# Patient Record
Sex: Female | Born: 1955 | Race: White | Hispanic: No | Marital: Married | Smoking: Former smoker
Health system: Southern US, Community
[De-identification: ages and names within clinical notes are randomized; demographics above are authoritative.]

## PROBLEM LIST (undated history)

## (undated) DIAGNOSIS — M7710 Lateral epicondylitis, unspecified elbow: Secondary | ICD-10-CM

## (undated) HISTORY — DX: Lateral epicondylitis, unspecified elbow: M77.10

---

## 2011-01-28 ENCOUNTER — Emergency Department (INDEPENDENT_AMBULATORY_CARE_PROVIDER_SITE_OTHER)
Admission: EM | Admit: 2011-01-28 | Discharge: 2011-01-28 | Disposition: A | Discharge status: Home / Self Care | Payer: 59 | Source: Home / Self Care | Attending: Emergency Medicine | Admitting: Emergency Medicine

## 2011-01-28 ENCOUNTER — Encounter: Payer: Self-pay | Admitting: *Deleted

## 2011-01-28 DIAGNOSIS — J069 Acute upper respiratory infection, unspecified: Secondary | ICD-10-CM

## 2011-01-28 LAB — POCT RAPID STREP A (OFFICE): Rapid Strep A Screen: NEGATIVE

## 2011-01-28 NOTE — ED Provider Notes (Signed)
History     CSN: [id number]  Arrival date & time 01/28/11  1016   First MD Initiated Contact with Patient 01/28/11 1051      Chief Complaint  Patient presents with  . Sore Throat  . Generalized Body Aches    (Consider location/radiation/quality/duration/timing/severity/associated sxs/prior treatment) HPI Judith Saunders is a 55 y.o. female who complains of onset of cold symptoms for a few days.  + sore throat No cough No pleuritic pain No wheezing + nasal congestion + post-nasal drainage + sinus pain/pressure No chest congestion No itchy/red eyes No earache No hemoptysis No SOB No chills/sweats No fever No nausea No vomiting No abdominal pain No diarrhea No skin rashes No fatigue + myalgias No headache     History reviewed. No pertinent past medical history.  History reviewed. No pertinent past surgical history.  History reviewed. No pertinent family history.  History  Substance Use Topics  . Smoking status: Former Smoker    Quit date: 01/27/2001  . Smokeless tobacco: Not on file  . Alcohol Use: No    OB History    Grav Para Term Preterm Abortions TAB SAB Ect Mult Living                  Review of Systems  Allergies  Eggs or egg-derived products  Home Medications   Current Outpatient Rx  Name Route Sig Dispense Refill  . ALPRAZOLAM 1 MG PO TABS Oral Take 1 mg by mouth at bedtime as needed.      . CHOLECALCIFEROL 400 UNITS PO TABS Oral Take by mouth.      . FLUOXETINE HCL 20 MG PO CAPS Oral Take 20 mg by mouth daily.        BP 133/85  Pulse 75  Temp(Src) 98.4 F (36.9 C) (Oral)  Resp 16  Ht 5' (1.524 m)  Wt 107 lb (48.535 kg)  BMI 20.90 kg/m2  SpO2 97%  Physical Exam  Nursing note and vitals reviewed. Constitutional: She is oriented to person, place, and time. She appears well-developed and well-nourished.  HENT:  Head: Normocephalic and atraumatic.  Right Ear: Tympanic membrane, external ear and ear canal normal.  Left Ear: Tympanic  membrane, external ear and ear canal normal.  Nose: Mucosal edema and rhinorrhea present.  Mouth/Throat: Posterior oropharyngeal erythema present. No oropharyngeal exudate or posterior oropharyngeal edema.  Eyes: No scleral icterus.  Neck: Neck supple.  Cardiovascular: Regular rhythm and normal heart sounds.   Pulmonary/Chest: Effort normal and breath sounds normal. No respiratory distress.  Neurological: She is alert and oriented to person, place, and time.  Skin: Skin is warm and dry.  Psychiatric: She has a normal mood and affect. Her speech is normal.    ED Course  Procedures (including critical care time)   Labs Reviewed  POCT RAPID STREP A (OFFICE)   No results found.   No diagnosis found.    MDM  1)  rapid strep test is negative. No antibiotics or prescriptions are given today since this is most likely viral. 2)  Use nasal saline solution (over the counter) at least 3 times a day. 3)  Use over the counter decongestants like Zyrtec-D every 12 hours as needed to help with congestion.  If you have hypertension, do not take medicines with sudafed.  4)  Can take tylenol every 6 hours or motrin every 8 hours for pain or fever. 5)  Follow up with your primary doctor if no improvement in 5-7 days, sooner if increasing pain,  fever, or new symptoms.     Lily Kocher, MD 01/28/11 1052

## 2011-01-28 NOTE — ED Notes (Signed)
Patient c/o body aches, sore throat and HA x 2 days. She has taken Motrin. She has not received a flu shot this year due to egg allergy.

## 2012-06-29 ENCOUNTER — Emergency Department (INDEPENDENT_AMBULATORY_CARE_PROVIDER_SITE_OTHER): Payer: 59

## 2012-06-29 ENCOUNTER — Emergency Department (INDEPENDENT_AMBULATORY_CARE_PROVIDER_SITE_OTHER)
Admission: EM | Admit: 2012-06-29 | Discharge: 2012-06-29 | Disposition: A | Discharge status: Home / Self Care | Payer: 59 | Source: Home / Self Care | Attending: Family Medicine | Admitting: Family Medicine

## 2012-06-29 ENCOUNTER — Encounter: Payer: Self-pay | Admitting: *Deleted

## 2012-06-29 DIAGNOSIS — IMO0002 Reserved for concepts with insufficient information to code with codable children: Secondary | ICD-10-CM

## 2012-06-29 DIAGNOSIS — M25569 Pain in unspecified knee: Secondary | ICD-10-CM

## 2012-06-29 DIAGNOSIS — W010XXA Fall on same level from slipping, tripping and stumbling without subsequent striking against object, initial encounter: Secondary | ICD-10-CM

## 2012-06-29 DIAGNOSIS — S8390XA Sprain of unspecified site of unspecified knee, initial encounter: Secondary | ICD-10-CM

## 2012-06-29 NOTE — ED Provider Notes (Signed)
History     CSN: [id number]  Arrival date & time 06/29/12  0805   First MD Initiated Contact with Patient 06/29/12 [phone]      Chief Complaint  Patient presents with  . Knee Pain   HPI  Bilateral knee pain x 1 day Pt accidentally fell over dog gate, landing on both knees. Landed on tile floor. Tiles did not break.  Has had anterior knee pain bilaterally since this point.  Pain mild to moderate in nature.  + Pain with knee flexion and extension.  No knee locking or giving away.  No prior hx/o knee surgery.   History reviewed. No pertinent past medical history.  History reviewed. No pertinent past surgical history.  History reviewed. No pertinent family history.  History  Substance Use Topics  . Smoking status: Former Smoker    Quit date: 01/27/2001  . Smokeless tobacco: Never Used  . Alcohol Use: No    OB History   Grav Para Term Preterm Abortions TAB SAB Ect Mult Living                  Review of Systems  All other systems reviewed and are negative.    Allergies  Eggs or egg-derived products and Prednisone  Home Medications   Current Outpatient Rx  Name  Route  Sig  Dispense  Refill  . ALPRAZolam (XANAX) 1 MG tablet   Oral   Take 1 mg by mouth at bedtime as needed.           . cholecalciferol (VITAMIN D) 400 UNITS TABS   Oral   Take by mouth.           Marland Kitchen FLUoxetine (PROZAC) 20 MG capsule   Oral   Take 20 mg by mouth daily.             BP 151/82  Pulse 98  Resp 16  Ht 5' (1.524 m)  Wt 120 lb (54.432 kg)  BMI 23.44 kg/m2  SpO2 99%  Physical Exam  Constitutional: She appears well-developed and well-nourished.  HENT:  Head: Normocephalic and atraumatic.  Eyes: Conjunctivae are normal. Pupils are equal, round, and reactive to light.  Neck: Normal range of motion.  Cardiovascular: Normal rate, regular rhythm and normal heart sounds.   Pulmonary/Chest: Effort normal.  Abdominal: Soft.  Musculoskeletal:       Legs: + TTP bilaterally    No effusions.  Full ROM  + pain with resisted knee extension. Neurovascularly intact distally  + minor soft tissue abrasions on R knee    Neurological: She is alert.  Skin: Skin is warm.    ED Course  Procedures (including critical care time)  Labs Reviewed - No data to display Dg Knee Complete 4 Views Left  06/29/2012   *RADIOLOGY REPORT*  Clinical Data: Fall.  Knee pain.  LEFT KNEE - COMPLETE 4+ VIEW  Comparison: None.  Findings: The left knee is located.  No acute bone or soft tissue abnormality is present.  There is no significant effusion.  The patella is intact.  IMPRESSION: Negative left knee radiographs.   Original Report Authenticated By: Marin Roberts, M.D.   Dg Knee Complete 4 Views Right  06/29/2012   *RADIOLOGY REPORT*  Clinical Data: Knee pain following injury  RIGHT KNEE - COMPLETE 4+ VIEW  Comparison: None.  Findings: No acute fracture or dislocation is noted.  No soft tissue abnormality is seen.  IMPRESSION: No acute abnormality noted.   Original Report Authenticated By: Loraine Leriche  Lukens, M.D.     1. Knee sprain, unspecified laterality, initial encounter       MDM  Xrays negative for fracture.  Suspect minor soft tissue injury.  Knee brace bilaterally  RICE and NSAIDs.  Abrasions cleansed and covered.  Discussed MSK and infectious red flags.  Follow up with sports medicine if any issues.     The patient and/or caregiver has been counseled thoroughly with regard to treatment plan and/or medications prescribed including dosage, schedule, interactions, rationale for use, and possible side effects and they verbalize understanding. Diagnoses and expected course of recovery discussed and will return if not improved as expected or if the condition worsens. Patient and/or caregiver verbalized understanding.            Doree Albee, MD 06/29/12 [phone]

## 2012-06-29 NOTE — ED Notes (Signed)
Yunique fell over a dog gate this AM and landed on both knees on a tile floor. Bilateral knee swelling present.

## 2014-10-06 ENCOUNTER — Ambulatory Visit: Payer: 59 | Attending: Orthopedic Surgery | Admitting: Physical Therapy

## 2014-10-06 ENCOUNTER — Encounter: Payer: Self-pay | Admitting: Physical Therapy

## 2014-10-06 DIAGNOSIS — M7542 Impingement syndrome of left shoulder: Secondary | ICD-10-CM | POA: Insufficient documentation

## 2014-10-06 DIAGNOSIS — M25512 Pain in left shoulder: Secondary | ICD-10-CM | POA: Diagnosis present

## 2014-10-06 NOTE — Therapy (Signed)
[facility] [address] [place], [place], [address] Phone: [phone]   Fax:  [phone]  Physical Therapy Evaluation  Patient Details  Name: Judith Saunders MRN: [id number] Date of Birth: 1955/10/26 Referring Provider:  Pill, Newton Pigg, MD  Encounter Date: 10/06/2014      PT End of Session - 10/06/14 1039    Visit Number 1   Number of Visits 12   Date for PT Re-Evaluation 11/17/14   PT Start Time 0950   PT Stop Time 1033   PT Time Calculation (min) 43 min   Activity Tolerance Patient tolerated treatment well   Behavior During Therapy [facility] for tasks assessed/performed      Past Medical History  Diagnosis Date  . Tennis elbow In past    Patient reports.    No past surgical history on file.  There were no vitals filed for this visit.  Visit Diagnosis:  Left shoulder pain - Plan: PT plan of care cert/re-cert  Shoulder impingement, left - Plan: PT plan of care cert/re-cert      Subjective Assessment - 10/06/14 1011    Subjective I hurt my left shoulder quite awhile ago working out and doing [employer].  My left arm burns.   Patient Stated Goals Get out of pain.            [facility] PT Assessment - 10/06/14 0001    Assessment   Medical Diagnosis Left shoulder pain.   Onset Date/Surgical Date --  1.5 years ago.   Precautions   Precautions None   Restrictions   Weight Bearing Restrictions No   Balance Screen   Has the patient fallen in the past 6 months No   Has the patient had a decrease in activity level because of a fear of falling?  No   Is the patient reluctant to leave their home because of a fear of falling?  No   Home Environment   Living Environment Private residence   ROM / Strength   AROM / PROM / Strength AROM;Strength   AROM   Overall AROM Comments Minimal left shoulder loss of AROM into flexion when contralaterally compared.   Strength   Overall Strength Comments Left shoulder abduction= 4-/5.   Palpation   Palpation comment Very hypersensitive to palpation over distal left triceps region.  Tendr to palpation over left posterior cuff region.   Special Tests    Special Tests Thoracic Outlet Syndrome;Rotator Cuff Impingement  UE DTR's are normal.   Thoracic Outlet Syndrome  Adson Test   Rotator Cuff Impingment tests Neer impingement test;Hawkins- Kennedy test   Adson Test   Findings Negative   Side  Left   Neer Impingement test    Findings Positive   Side Left   Comments --  Referred pain into left middle deltoid region.   Hawkins-Kennedy test   Findings Positive   Side Left   Comments --  Referred pain into left middle deltoid region.                   [facility] Adult PT Treatment/Exercise - 10/06/14 0001    Modalities   Modalities Iontophoresis   Iontophoresis   Type of Iontophoresis Dexamethasone   Location Left acromial ridge left shoulder.   Dose 80 mA/Min   Time Patch.                     PT Long Term Goals - 10/06/14 1025    PT LONG  TERM GOAL #1   Title Ind with HEP.   PT LONG TERM GOAL #2   Title Reduce pain and left UE symptoms to no higher than 2-3/10.               Plan - 10/06/14 1020    Clinical Impression Statement the patient reports injuring her left shoulder more than a year ago lifting weights.  Her CC today however, is a burning over her distal left upper arm that is very sensitive to touch.  Her pain-level today is a 5-6/10 but can at times reach a 10/10.   Pt will benefit from skilled therapeutic intervention in order to improve on the following deficits Pain;Decreased activity tolerance   Rehab Potential Good   PT Frequency 2x / week   PT Duration 6 weeks   PT Treatment/Interventions Cryotherapy;Electrical Stimulation;Iontophoresis 4mg /ml Dexamethasone;Moist Heat;Ultrasound;Therapeutic activities;Therapeutic exercise;Manual techniques   PT Next Visit Plan Please perform STW/M from patient's left elbow region along  her triceps and into herinfraspinatus and Teres Minor region.  Combo e'stim and U/S and Ionto patch.  Also left RW4 and full can exercise on left and sdly ER.   Consulted and Agree with Plan of Care Patient         Problem List There are no active problems to display for this patient.   Alida Greiner, [place] MPT 10/06/2014, 10:42 AM  [facility] [address] [place], [place], [address] Phone: [phone]   Fax:  [phone]

## 2014-10-12 ENCOUNTER — Ambulatory Visit: Payer: 59 | Admitting: Physical Therapy

## 2014-10-12 ENCOUNTER — Encounter: Payer: Self-pay | Admitting: Physical Therapy

## 2014-10-12 DIAGNOSIS — M25512 Pain in left shoulder: Secondary | ICD-10-CM

## 2014-10-12 DIAGNOSIS — M7542 Impingement syndrome of left shoulder: Secondary | ICD-10-CM

## 2014-10-12 NOTE — Therapy (Signed)
[facility] [address] [place], [place], [address] Phone: [phone]   Fax:  [phone]  Physical Therapy Treatment  Patient Details  Name: Judith Saunders MRN: [id number] Date of Birth: [birth year] Referring Provider:  Pill, Newton Pigg, MD  Encounter Date: 10/12/2014      PT End of Session - 10/12/14 1050    Visit Number 2   Number of Visits 12   Date for PT Re-Evaluation 11/17/14   PT Start Time 0900   PT Stop Time 0947   PT Time Calculation (min) 47 min   Activity Tolerance Patient tolerated treatment well   Behavior During Therapy [facility] for tasks assessed/performed      Past Medical History  Diagnosis Date  . Tennis elbow In past    Patient reports.    No past surgical history on file.  There were no vitals filed for this visit.  Visit Diagnosis:  Left shoulder pain  Shoulder impingement, left      Subjective Assessment - 10/12/14 1049    Subjective Reports that upon waking and for a few hours following she has burning, stabbing pain. Throughout the day she may have shocking pain. Reports a tender area near her L elbow.   Patient Stated Goals Get out of pain.   Currently in Pain? Yes   Pain Score 5    Pain Location Elbow   Pain Orientation Left;Posterior   Pain Descriptors / Indicators Burning            [facility] PT Assessment - 10/12/14 0001    Assessment   Medical Diagnosis Left shoulder pain.                     [facility] Adult PT Treatment/Exercise - 10/12/14 0001    Modalities   Modalities Ultrasound;Iontophoresis   Ultrasound   Ultrasound Location L posterior elbow   Ultrasound Parameters 1.2 w/cm2, 100%, 3.3 mhz x10 min   Ultrasound Goals Pain   Iontophoresis   Type of Iontophoresis Dexamethasone   Location L posterior humerus   Dose 80 mA/Min   Time Patch. x8 min   Manual Therapy   Manual Therapy Soft tissue mobilization   Soft tissue mobilization STW/MFR over the L posterior elbow/  humerus/ posterior shoulder to decrease pain                      PT Long Term Goals - 10/06/14 1025    PT LONG TERM GOAL #1   Title Ind with HEP.   PT LONG TERM GOAL #2   Title Reduce pain and left UE symptoms to no higher than 2-3/10.               Plan - 10/12/14 1053    Clinical Impression Statement Patient tolerated treatment well today and only experienced pain during any palpation over the tender area in the posterior L humerus. The tender area is in the posterior L humerus close to tendon junction of the triceps. No abnormal tightness noted in throughout the L posterior shoulder, posterior humerus or elbow. Iontophoresis patch was placed over that spot per consultation with the MPT. Normal modalties response noted following removal of the modalities. Denied pain following treatment.   Pt will benefit from skilled therapeutic intervention in order to improve on the following deficits Pain;Decreased activity tolerance   Rehab Potential Good   PT Frequency 2x / week   PT Duration 6 weeks   PT Treatment/Interventions Cryotherapy;Electrical Stimulation;Iontophoresis  /ml Dexamethasone;Moist Heat;Ultrasound;Therapeutic activities;Therapeutic exercise;Manual techniques   PT Next Visit Plan Please perform STW/M from patient's left elbow region along her triceps and into herinfraspinatus and Teres Minor region.  Combo e'stim and U/S and Ionto patch.  Also left RW4 and full can exercise on left and sdly ER.   Consulted and Agree with Plan of Care Patient        Problem List There are no active problems to display for this patient.   [name], PTA 10/12/2014, 11:00 AM  [facility] [address] [place], [place], [address] Phone: [phone]   Fax:  [phone]

## 2014-10-14 ENCOUNTER — Ambulatory Visit: Payer: 59 | Admitting: Physical Therapy

## 2014-10-14 DIAGNOSIS — M25512 Pain in left shoulder: Secondary | ICD-10-CM

## 2014-10-14 DIAGNOSIS — M7542 Impingement syndrome of left shoulder: Secondary | ICD-10-CM

## 2014-10-14 NOTE — Therapy (Signed)
[facility] [address] [place], [place], [address] Phone: [phone]   Fax:  [phone]  Physical Therapy Treatment  Patient Details  Name: Judith Saunders MRN: [id number] Date of Birth: 12/06/1955 Referring Provider:  Pill, Newton Pigg, MD  Encounter Date: 10/14/2014      PT End of Session - 10/14/14 0901    Visit Number 3   Number of Visits 12   Date for PT Re-Evaluation 11/17/14   PT Start Time 0901   PT Stop Time 0945   PT Time Calculation (min) 44 min   Activity Tolerance Patient tolerated treatment well   Behavior During Therapy [facility] for tasks assessed/performed      Past Medical History  Diagnosis Date  . Tennis elbow In past    Patient reports.    No past surgical history on file.  There were no vitals filed for this visit.  Visit Diagnosis:  Left shoulder pain  Shoulder impingement, left      Subjective Assessment - 10/14/14 0901    Subjective Reports some relief yesterday morning but symptoms returned after activity/ after waking. Morning pain yesterday and today 10/10. Feels like a sunburn and shoes, sheets hurt it as well.   Patient Stated Goals Get out of pain.   Currently in Pain? Yes   Pain Score 5    Pain Location Arm   Pain Orientation Left;Posterior   Pain Descriptors / Indicators Burning            [facility] PT Assessment - 10/14/14 0001    Assessment   Medical Diagnosis (p) Left shoulder pain.                     [facility] Adult PT Treatment/Exercise - 10/14/14 0001    Modalities   Modalities Ultrasound;Iontophoresis   Ultrasound   Ultrasound Location L posteriolateral humerus   Ultrasound Parameters 1.2 w/cm2, 100%,1 mhz   Ultrasound Goals Pain   Iontophoresis   Type of Iontophoresis Dexamethasone   Location L posterior humerus   Dose 80 mA/Min   Time Patch. x8 min   Manual Therapy   Manual Therapy Soft tissue mobilization   Soft tissue mobilization STW/MFR over the L  posterior elbow/ humerus/ posterior shoulder to decrease pain                      PT Long Term Goals - 10/06/14 1025    PT LONG TERM GOAL #1   Title Ind with HEP.   PT LONG TERM GOAL #2   Title Reduce pain and left UE symptoms to no higher than 2-3/10.               Plan - 10/14/14 0951    Clinical Impression Statement Patient tolerated treatment well today with some episodes of pain during STW over the L posteriolateral humerus. Presented with minimal notable tightness and inflammation to the L distal humerus. Experienced shock/shooting pain during STW when the tender area was palpated. Normal modalities response noted following removal of the modalities. Iontophoresis patch placed over the L posteriolateral humerus. Expereinced 5/10 pain following treatment with reports of irritation to the treated area.   Pt will benefit from skilled therapeutic intervention in order to improve on the following deficits Pain;Decreased activity tolerance   Rehab Potential Good   PT Frequency 2x / week   PT Duration 6 weeks   PT Treatment/Interventions Cryotherapy;Electrical Stimulation;Iontophoresis /ml Dexamethasone;Moist Heat;Ultrasound;Therapeutic activities;Therapeutic exercise;Manual techniques   PT Next  Visit Plan Please perform STW/M from patient's left elbow region along her triceps and into herinfraspinatus and Teres Minor region.  Combo e'stim and U/S and Ionto patch.  Also left RW4 and full can exercise on left and sdly ER.   Consulted and Agree with Plan of Care Patient        Problem List There are no active problems to display for this patient.   [name], PTA 10/14/2014, 9:56 AM  [facility] [address] [place], [place], [address] Phone: [phone]   Fax:  [phone]

## 2014-10-18 ENCOUNTER — Encounter: Payer: Self-pay | Admitting: Physical Therapy

## 2014-10-18 ENCOUNTER — Ambulatory Visit: Payer: 59 | Admitting: Physical Therapy

## 2014-10-18 DIAGNOSIS — M7542 Impingement syndrome of left shoulder: Secondary | ICD-10-CM

## 2014-10-18 DIAGNOSIS — M25512 Pain in left shoulder: Secondary | ICD-10-CM

## 2014-10-18 NOTE — Therapy (Signed)
[facility] [address] [place], [place], [address] Phone: [phone]   Fax:  [phone]  Physical Therapy Treatment  Patient Details  Name: Judith Saunders MRN: [id number] Date of Birth: November 10, 1955 Referring Provider:  Pill, Newton Pigg, MD  Encounter Date: 10/18/2014      PT End of Session - 10/18/14 0904    Visit Number 4   Number of Visits 12   Date for PT Re-Evaluation 11/17/14   PT Start Time 0902   PT Stop Time 0945   PT Time Calculation (min) 43 min   Activity Tolerance Patient tolerated treatment well   Behavior During Therapy [facility] for tasks assessed/performed      Past Medical History  Diagnosis Date  . Tennis elbow In past    Patient reports.    No past surgical history on file.  There were no vitals filed for this visit.  Visit Diagnosis:  Left shoulder pain  Shoulder impingement, left      Subjective Assessment - 10/18/14 0902    Subjective Pain started as soon as she woke up yesterday and the morning for hours with burning and shocking pain. Reports feeling that it is her whole arm hurting now. Switched to R side of bed, not changed to extreme activities at home, not picking up heavy things, pain with extending L elbow to pick up coffee cup,   Patient Stated Goals Get out of pain.   Currently in Pain? Yes   Pain Score 7    Pain Location Arm   Pain Orientation Left;Posterior   Pain Descriptors / Indicators Burning                         [facility] Adult PT Treatment/Exercise - 10/18/14 0001    Modalities   Modalities Ultrasound;Iontophoresis   Ultrasound   Ultrasound Location L posterior humerus   Ultrasound Parameters 1.2 w/cm2, 100%,   Ultrasound Goals Pain   Iontophoresis   Type of Iontophoresis Dexamethasone   Location L posterior humerus   Dose 80 mA/Min   Time Patch. x8 min   Manual Therapy   Manual Therapy Soft tissue mobilization   Soft tissue mobilization STW/MFR over  the L posterior elbow/ humerus/ posterior shoulder to decrease pain                      PT Long Term Goals - 10/06/14 1025    PT LONG TERM GOAL #1   Title Ind with HEP.   PT LONG TERM GOAL #2   Title Reduce pain and left UE symptoms to no higher than 2-3/10.               Plan - 10/18/14 0957    Clinical Impression Statement Patient tolerated treatment fairly well today although she had more painful sensations during any contact with L posterior humerus. Continues to have minimal notable inflammation and tightness in L posterior humerus. Continues to report shock/burning sensations at home intermittantly during the day. Normal modalities response noted following removal of the modalities. Iontophoresi patch placed over distal L posterior humerus. Experienced 7/10 pain following treatment.   Pt will benefit from skilled therapeutic intervention in order to improve on the following deficits Pain;Decreased activity tolerance   Rehab Potential Good   PT Frequency 2x / week   PT Duration 6 weeks   PT Treatment/Interventions Cryotherapy;Electrical Stimulation;Iontophoresis /ml Dexamethasone;Moist Heat;Ultrasound;Therapeutic activities;Therapeutic exercise;Manual techniques   PT Next Visit Plan Please  perform STW/M from patient's left elbow region along her triceps and into herinfraspinatus and Teres Minor region.  Combo e'stim and U/S and Ionto patch.  Also left RW4 and full can exercise on left and sdly ER.   Consulted and Agree with Plan of Care Patient        Problem List There are no active problems to display for this patient.   [name], PTA 10/18/2014, 10:03 AM  [facility] [address] [place], [place], [address] Phone: [phone]   Fax:  [phone]

## 2014-10-20 ENCOUNTER — Ambulatory Visit: Payer: 59 | Admitting: Physical Therapy

## 2014-10-20 ENCOUNTER — Encounter: Payer: Self-pay | Admitting: Physical Therapy

## 2014-10-20 DIAGNOSIS — M7542 Impingement syndrome of left shoulder: Secondary | ICD-10-CM

## 2014-10-20 DIAGNOSIS — M25512 Pain in left shoulder: Secondary | ICD-10-CM

## 2014-10-20 NOTE — Therapy (Signed)
[facility] [address] [place], [place], [address] Phone: [phone]   Fax:  [phone]  Physical Therapy Treatment  Patient Details  Name: Judith Saunders MRN: [id number] Date of Birth: 01-18-1956 Referring Provider:  Pill, Newton Pigg, MD  Encounter Date: 10/20/2014      PT End of Session - 10/20/14 0953    Visit Number 5   Number of Visits 12   PT Start Time 0906   PT Stop Time 0945   PT Time Calculation (min) 39 min   Activity Tolerance Patient tolerated treatment well   Behavior During Therapy [facility] for tasks assessed/performed      Past Medical History  Diagnosis Date  . Tennis elbow In past    Patient reports.    No past surgical history on file.  There were no vitals filed for this visit.  Visit Diagnosis:  Left shoulder pain  Shoulder impingement, left      Subjective Assessment - 10/20/14 1021    Subjective Reports that she picked up a case of beer with RUE yesterday and RUE began hurting. Continues to have shooting/burning/shocking pain and skin sensitivity.            [facility] PT Assessment - 10/20/14 0001    Assessment   Medical Diagnosis Left shoulder pain.                     [facility] Adult PT Treatment/Exercise - 10/20/14 0001    Modalities   Modalities Ultrasound;Iontophoresis   Ultrasound   Ultrasound Location L posterior humerus   Ultrasound Parameters 1.2 w/cm2, 100%, 1 mhz x10 min   Ultrasound Goals Pain   Iontophoresis   Type of Iontophoresis Dexamethasone   Location L posterior humerus   Dose 80 mA/Min   Time Patch. x8 min   Manual Therapy   Manual Therapy Soft tissue mobilization   Soft tissue mobilization STW/MFR over the L posterior elbow/ humerus to decrease pain                      PT Long Term Goals - 10/06/14 1025    PT LONG TERM GOAL #1   Title Ind with HEP.   PT LONG TERM GOAL #2   Title Reduce pain and left UE symptoms to no higher than 2-3/10.               Plan - 10/20/14 0955    Clinical Impression Statement Patient tolerated treatment fairly well today although she experienced the intermitant skin sensitivity during any contact with L posterior humerus. Continues to have minimal notable inflammation and tightness in L posterior humerus. Continues to report the burning/shocking/shooting pain intermittantly throughout the day at home. Normal modaliites response noted following removal of the modalities. Iontophoresis patch placed over the distal L posterior humerus.    Pt will benefit from skilled therapeutic intervention in order to improve on the following deficits Pain;Decreased activity tolerance   Rehab Potential Good   PT Frequency 2x / week   PT Duration 6 weeks   PT Treatment/Interventions Cryotherapy;Electrical Stimulation;Iontophoresis /ml Dexamethasone;Moist Heat;Ultrasound;Therapeutic activities;Therapeutic exercise;Manual techniques   PT Next Visit Plan Please perform STW/M from patient's left elbow region along her triceps and into herinfraspinatus and [place] region.  Combo e'stim and U/S and Ionto patch.  Also left RW4 and full can exercise on left and sdly ER.   Consulted and Agree with Plan of Care Patient     No significant relief  of symptoms after 5 treatments.   Problem List There are no active problems to display for this patient.   Florence Canner, PTA 10/20/2014 10:27 AM  [place] MPT [facility] [address] [place], [place], [address] Phone: [phone]   Fax:  [phone]

## 2014-10-24 ENCOUNTER — Encounter: Payer: 59 | Admitting: Physical Therapy

## 2014-10-26 ENCOUNTER — Encounter: Payer: 59 | Admitting: Physical Therapy

## 2014-11-02 ENCOUNTER — Telehealth: Payer: Self-pay | Admitting: Family

## 2014-11-04 ENCOUNTER — Encounter: Payer: Self-pay | Admitting: Family

## 2014-11-04 ENCOUNTER — Ambulatory Visit (INDEPENDENT_AMBULATORY_CARE_PROVIDER_SITE_OTHER): Payer: 59 | Admitting: Family

## 2014-11-04 VITALS — BP 135/80 | HR 74 | Temp 97.7°F | Ht 59.75 in | Wt 125.2 lb

## 2014-11-04 DIAGNOSIS — F411 Generalized anxiety disorder: Secondary | ICD-10-CM | POA: Diagnosis not present

## 2014-11-04 DIAGNOSIS — M79602 Pain in left arm: Secondary | ICD-10-CM | POA: Insufficient documentation

## 2014-11-04 NOTE — Progress Notes (Signed)
   Subjective:    Patient ID: Judith Saunders, female    DOB: 1955/11/01, 59 y.o.   MRN: [id number]  Pt presents to the office today to establish care. Pt states she moved to this area in February and hates to travel to [place] for her primary care provider. PT currently taking medication for Insomnia and GAD. Pt states she is seeing an ortho doctor who manages  her left arm/shoulder pain. Pt states she started having left arm pain a year ago. PT states she is taking Norco 5-$RemoveBefore'325mg'YOocsaCOSSuFG$  or Ultram one tablet daily. States she only takes one medication and 1 tablet daily depending on the pain.  Anxiety Presents for initial visit. Onset was more than 5 years ago. The problem has been waxing and waning. Symptoms include insomnia. Patient reports no depressed mood, excessive worry, nervous/anxious behavior, palpitations, panic, restlessness or shortness of breath. Symptoms occur rarely. The severity of symptoms is mild. The symptoms are aggravated by family issues. The quality of sleep is good.   Her past medical history is significant for anxiety/panic attacks. There is no history of depression. Past treatments include benzodiazephines.      Review of Systems  Constitutional: Negative.   HENT: Negative.   Eyes: Negative.   Respiratory: Negative.  Negative for shortness of breath.   Cardiovascular: Negative.  Negative for palpitations.  Gastrointestinal: Negative.   Endocrine: Negative.   Genitourinary: Negative.   Musculoskeletal: Negative.   Neurological: Negative.  Negative for headaches.  Hematological: Negative.   Psychiatric/Behavioral: The patient has insomnia. The patient is not nervous/anxious.   All other systems reviewed and are negative.      Objective:   Physical Exam  Constitutional: She is oriented to person, place, and time. She appears well-developed and well-nourished. No distress.  HENT:  Head: Normocephalic and atraumatic.  Right Ear: External ear normal.  Left Ear:  External ear normal.  Nose: Nose normal.  Mouth/Throat: Oropharynx is clear and moist.  Eyes: Pupils are equal, round, and reactive to light.  Neck: Normal range of motion. Neck supple. No thyromegaly present.  Cardiovascular: Normal rate, regular rhythm, normal heart sounds and intact distal pulses.   No murmur heard. Pulmonary/Chest: Effort normal and breath sounds normal. No respiratory distress. She has no wheezes.  Abdominal: Soft. Bowel sounds are normal. She exhibits no distension. There is no tenderness.  Musculoskeletal: Normal range of motion. She exhibits no edema or tenderness.  Neurological: She is alert and oriented to person, place, and time. She has normal reflexes. No cranial nerve deficit.  Skin: Skin is warm and dry.  Psychiatric: She has a normal mood and affect. Her behavior is normal. Judgment and thought content normal.  Vitals reviewed.     BP 135/80 mmHg  Pulse 74  Temp(Src) 97.7 F (36.5 C) (Oral)  Ht 4' 11.75" (1.518 m)  Wt 125 lb 3.2 oz (56.79 kg)  BMI 24.64 kg/m2     Assessment & Plan:  1. GAD (generalized anxiety disorder) -Stress management discussed - CMP14+EGFR  2. Left arm pain -Keep ortho appointments - CMP14+EGFR  Discussed with patient I could not prescribe her xanax and ativan together. Pt states she is okay with this because she only takes the ativan every once in awhile.   Continue all meds Labs pending Health Maintenance reviewed Diet and exercise encouraged RTO 6 months   Evelina Dun, FNP

## 2014-11-04 NOTE — Patient Instructions (Signed)
Health Maintenance, Female Adopting a healthy lifestyle and getting preventive care can go a long way to promote health and wellness. Talk with your health care provider about what schedule of regular examinations is right for you. This is a good chance for you to check in with your provider about disease prevention and staying healthy. In between checkups, there are plenty of things you can do on your own. Experts have done a lot of research about which lifestyle changes and preventive measures are most likely to keep you healthy. Ask your health care provider for more information. WEIGHT AND DIET  Eat a healthy diet  Be sure to include plenty of vegetables, fruits, low-fat dairy products, and lean protein.  Do not eat a lot of foods high in solid fats, added sugars, or salt.  Get regular exercise. This is one of the most important things you can do for your health.  Most adults should exercise for at least 150 minutes each week. The exercise should increase your heart rate and make you sweat (moderate-intensity exercise).  Most adults should also do strengthening exercises at least twice a week. This is in addition to the moderate-intensity exercise.  Maintain a healthy weight  Body mass index (BMI) is a measurement that can be used to identify possible weight problems. It estimates body fat based on height and weight. Your health care provider can help determine your BMI and help you achieve or maintain a healthy weight.  For females 20 years of age and older:   A BMI below 18.5 is considered underweight.  A BMI of 18.5 to 24.9 is normal.  A BMI of 25 to 29.9 is considered overweight.  A BMI of 30 and above is considered obese.  Watch levels of cholesterol and blood lipids  You should start having your blood tested for lipids and cholesterol at 58 years of age, then have this test every 5 years.  You may need to have your cholesterol levels checked more often if:  Your lipid  or cholesterol levels are high.  You are older than 58 years of age.  You are at high risk for heart disease.  CANCER SCREENING   Lung Cancer  Lung cancer screening is recommended for adults 55-80 years old who are at high risk for lung cancer because of a history of smoking.  A yearly low-dose CT scan of the lungs is recommended for people who:  Currently smoke.  Have quit within the past 15 years.  Have at least a 30-pack-year history of smoking. A pack year is smoking an average of one pack of cigarettes a day for 1 year.  Yearly screening should continue until it has been 15 years since you quit.  Yearly screening should stop if you develop a health problem that would prevent you from having lung cancer treatment.  Breast Cancer  Practice breast self-awareness. This means understanding how your breasts normally appear and feel.  It also means doing regular breast self-exams. Let your health care provider know about any changes, no matter how small.  If you are in your 20s or 30s, you should have a clinical breast exam (CBE) by a health care provider every 1-3 years as part of a regular health exam.  If you are 40 or older, have a CBE every year. Also consider having a breast X-ray (mammogram) every year.  If you have a family history of breast cancer, talk to your health care provider about genetic screening.  If you   are at high risk for breast cancer, talk to your health care provider about having an MRI and a mammogram every year.  Breast cancer gene (BRCA) assessment is recommended for women who have family members with BRCA-related cancers. BRCA-related cancers include:  Breast.  Ovarian.  Tubal.  Peritoneal cancers.  Results of the assessment will determine the need for genetic counseling and BRCA1 and BRCA2 testing. Cervical Cancer Your health care provider may recommend that you be screened regularly for cancer of the pelvic organs (ovaries, uterus, and  vagina). This screening involves a pelvic examination, including checking for microscopic changes to the surface of your cervix (Pap test). You may be encouraged to have this screening done every 3 years, beginning at age 21.  For women ages 30-65, health care providers may recommend pelvic exams and Pap testing every 3 years, or they may recommend the Pap and pelvic exam, combined with testing for human papilloma virus (HPV), every 5 years. Some types of HPV increase your risk of cervical cancer. Testing for HPV may also be done on women of any age with unclear Pap test results.  Other health care providers may not recommend any screening for nonpregnant women who are considered low risk for pelvic cancer and who do not have symptoms. Ask your health care provider if a screening pelvic exam is right for you.  If you have had past treatment for cervical cancer or a condition that could lead to cancer, you need Pap tests and screening for cancer for at least 20 years after your treatment. If Pap tests have been discontinued, your risk factors (such as having a new sexual partner) need to be reassessed to determine if screening should resume. Some women have medical problems that increase the chance of getting cervical cancer. In these cases, your health care provider may recommend more frequent screening and Pap tests. Colorectal Cancer  This type of cancer can be detected and often prevented.  Routine colorectal cancer screening usually begins at 58 years of age and continues through 58 years of age.  Your health care provider may recommend screening at an earlier age if you have risk factors for colon cancer.  Your health care provider may also recommend using home test kits to check for hidden blood in the stool.  A small camera at the end of a tube can be used to examine your colon directly (sigmoidoscopy or colonoscopy). This is done to check for the earliest forms of colorectal  cancer.  Routine screening usually begins at age 50.  Direct examination of the colon should be repeated every 5-10 years through 58 years of age. However, you may need to be screened more often if early forms of precancerous polyps or small growths are found. Skin Cancer  Check your skin from head to toe regularly.  Tell your health care provider about any new moles or changes in moles, especially if there is a change in a mole's shape or color.  Also tell your health care provider if you have a mole that is larger than the size of a pencil eraser.  Always use sunscreen. Apply sunscreen liberally and repeatedly throughout the day.  Protect yourself by wearing long sleeves, pants, a wide-brimmed hat, and sunglasses whenever you are outside. HEART DISEASE, DIABETES, AND HIGH BLOOD PRESSURE   High blood pressure causes heart disease and increases the risk of stroke. High blood pressure is more likely to develop in:  People who have blood pressure in the high end   of the normal range (130-139/85-89 mm Hg).  People who are overweight or obese.  People who are African American.  If you are 38-23 years of age, have your blood pressure checked every 3-5 years. If you are 61 years of age or older, have your blood pressure checked every year. You should have your blood pressure measured twice--once when you are at a hospital or clinic, and once when you are not at a hospital or clinic. Record the average of the two measurements. To check your blood pressure when you are not at a hospital or clinic, you can use:  An automated blood pressure machine at a pharmacy.  A home blood pressure monitor.  If you are between 45 years and 39 years old, ask your health care provider if you should take aspirin to prevent strokes.  Have regular diabetes screenings. This involves taking a blood sample to check your fasting blood sugar level.  If you are at a normal weight and have a low risk for diabetes,  have this test once every three years after 58 years of age.  If you are overweight and have a high risk for diabetes, consider being tested at a younger age or more often. PREVENTING INFECTION  Hepatitis B  If you have a higher risk for hepatitis B, you should be screened for this virus. You are considered at high risk for hepatitis B if:  You were born in a country where hepatitis B is common. Ask your health care provider which countries are considered high risk.  Your parents were born in a high-risk country, and you have not been immunized against hepatitis B (hepatitis B vaccine).  You have HIV or AIDS.  You use needles to inject street drugs.  You live with someone who has hepatitis B.  You have had sex with someone who has hepatitis B.  You get hemodialysis treatment.  You take certain medicines for conditions, including cancer, organ transplantation, and autoimmune conditions. Hepatitis C  Blood testing is recommended for:  Everyone born from [birth year] through 1964.  Anyone with known risk factors for hepatitis C. Sexually transmitted infections (STIs)  You should be screened for sexually transmitted infections (STIs) including gonorrhea and chlamydia if:  You are sexually active and are younger than 58 years of age.  You are older than 58 years of age and your health care provider tells you that you are at risk for this type of infection.  Your sexual activity has changed since you were last screened and you are at an increased risk for chlamydia or gonorrhea. Ask your health care provider if you are at risk.  If you do not have HIV, but are at risk, it may be recommended that you take a prescription medicine daily to prevent HIV infection. This is called pre-exposure prophylaxis (PrEP). You are considered at risk if:  You are sexually active and do not regularly use condoms or know the HIV status of your partner(s).  You take drugs by injection.  You are sexually  active with a partner who has HIV. Talk with your health care provider about whether you are at high risk of being infected with HIV. If you choose to begin PrEP, you should first be tested for HIV. You should then be tested every 3 months for as long as you are taking PrEP.  PREGNANCY   If you are premenopausal and you may become pregnant, ask your health care provider about preconception counseling.  If you may  become pregnant, take 400 to 800 micrograms (mcg) of folic acid every day.  If you want to prevent pregnancy, talk to your health care provider about birth control (contraception). OSTEOPOROSIS AND MENOPAUSE   Osteoporosis is a disease in which the bones lose minerals and strength with aging. This can result in serious bone fractures. Your risk for osteoporosis can be identified using a bone density scan.  If you are 61 years of age or older, or if you are at risk for osteoporosis and fractures, ask your health care provider if you should be screened.  Ask your health care provider whether you should take a calcium or vitamin D supplement to lower your risk for osteoporosis.  Menopause may have certain physical symptoms and risks.  Hormone replacement therapy may reduce some of these symptoms and risks. Talk to your health care provider about whether hormone replacement therapy is right for you.  HOME CARE INSTRUCTIONS   Schedule regular health, dental, and eye exams.  Stay current with your immunizations.   Do not use any tobacco products including cigarettes, chewing tobacco, or electronic cigarettes.  If you are pregnant, do not drink alcohol.  If you are breastfeeding, limit how much and how often you drink alcohol.  Limit alcohol intake to no more than 1 drink per day for nonpregnant women. One drink equals 12 ounces of beer, 5 ounces of wine, or 1 ounces of hard liquor.  Do not use street drugs.  Do not share needles.  Ask your health care provider for help if  you need support or information about quitting drugs.  Tell your health care provider if you often feel depressed.  Tell your health care provider if you have ever been abused or do not feel safe at home.   This information is not intended to replace advice given to you by your health care provider. Make sure you discuss any questions you have with your health care provider.   Document Released: 07/30/2010 Document Revised: 02/04/2014 Document Reviewed: 12/16/2012 Elsevier Interactive Patient Education [employer].

## 2014-11-05 LAB — CMP14+EGFR
ALK PHOS: 58 IU/L (ref 39–117)
ALT: 11 IU/L (ref 0–32)
AST: 16 IU/L (ref 0–40)
Albumin/Globulin Ratio: 1.6 (ref 1.1–2.5)
Albumin: 4.5 g/dL (ref 3.5–5.5)
BUN/Creatinine Ratio: 20 (ref 9–23)
BUN: 16 mg/dL (ref 6–24)
Bilirubin Total: 0.3 mg/dL (ref 0.0–1.2)
CALCIUM: 9.5 mg/dL (ref 8.7–10.2)
CO2: 26 mmol/L (ref 18–29)
CREATININE: 0.79 mg/dL (ref 0.57–1.00)
Chloride: 101 mmol/L (ref 97–108)
GFR calc Af Amer: 96 mL/min/{1.73_m2} (ref >59)
GFR, EST NON AFRICAN AMERICAN: 83 mL/min/{1.73_m2} (ref >59)
GLOBULIN, TOTAL: 2.8 g/dL (ref 1.5–4.5)
GLUCOSE: 109 mg/dL — AB (ref 65–99)
Potassium: 4.6 mmol/L (ref 3.5–5.2)
SODIUM: 143 mmol/L (ref 134–144)
Total Protein: 7.3 g/dL (ref 6.0–8.5)

## 2015-01-27 ENCOUNTER — Encounter: Payer: Self-pay | Admitting: *Deleted

## 2015-02-07 ENCOUNTER — Other Ambulatory Visit: Payer: 59 | Admitting: Family

## 2015-02-13 ENCOUNTER — Other Ambulatory Visit: Payer: 59 | Admitting: Family

## 2015-02-20 ENCOUNTER — Other Ambulatory Visit: Payer: 59 | Admitting: Family

## 2015-04-17 ENCOUNTER — Encounter: Payer: Self-pay | Admitting: Family

## 2015-04-17 ENCOUNTER — Ambulatory Visit (INDEPENDENT_AMBULATORY_CARE_PROVIDER_SITE_OTHER): Payer: 59 | Admitting: Family

## 2015-04-17 VITALS — BP 137/78 | HR 88 | Temp 97.2°F | Ht 59.75 in | Wt 125.0 lb

## 2015-04-17 DIAGNOSIS — M79602 Pain in left arm: Secondary | ICD-10-CM | POA: Diagnosis not present

## 2015-04-17 DIAGNOSIS — F411 Generalized anxiety disorder: Secondary | ICD-10-CM | POA: Diagnosis not present

## 2015-04-17 DIAGNOSIS — Z01419 Encounter for gynecological examination (general) (routine) without abnormal findings: Secondary | ICD-10-CM

## 2015-04-17 NOTE — Patient Instructions (Signed)
Health Maintenance, Female Adopting a healthy lifestyle and getting preventive care can go a long way to promote health and wellness. Talk with your health care provider about what schedule of regular examinations is right for you. This is a good chance for you to check in with your provider about disease prevention and staying healthy. In between checkups, there are plenty of things you can do on your own. Experts have done a lot of research about which lifestyle changes and preventive measures are most likely to keep you healthy. Ask your health care provider for more information. WEIGHT AND DIET  Eat a healthy diet  Be sure to include plenty of vegetables, fruits, low-fat dairy products, and lean protein.  Do not eat a lot of foods high in solid fats, added sugars, or salt.  Get regular exercise. This is one of the most important things you can do for your health.  Most adults should exercise for at least 150 minutes each week. The exercise should increase your heart rate and make you sweat (moderate-intensity exercise).  Most adults should also do strengthening exercises at least twice a week. This is in addition to the moderate-intensity exercise.  Maintain a healthy weight  Body mass index (BMI) is a measurement that can be used to identify possible weight problems. It estimates body fat based on height and weight. Your health care provider can help determine your BMI and help you achieve or maintain a healthy weight.  For females 20 years of age and older:   A BMI below 18.5 is considered underweight.  A BMI of 18.5 to 24.9 is normal.  A BMI of 25 to 29.9 is considered overweight.  A BMI of 30 and above is considered obese.  Watch levels of cholesterol and blood lipids  You should start having your blood tested for lipids and cholesterol at 59 years of age, then have this test every 5 years.  You may need to have your cholesterol levels checked more often if:  Your lipid  or cholesterol levels are high.  You are older than 59 years of age.  You are at high risk for heart disease.  CANCER SCREENING   Lung Cancer  Lung cancer screening is recommended for adults 55-80 years old who are at high risk for lung cancer because of a history of smoking.  A yearly low-dose CT scan of the lungs is recommended for people who:  Currently smoke.  Have quit within the past 15 years.  Have at least a 30-pack-year history of smoking. A pack year is smoking an average of one pack of cigarettes a day for 1 year.  Yearly screening should continue until it has been 15 years since you quit.  Yearly screening should stop if you develop a health problem that would prevent you from having lung cancer treatment.  Breast Cancer  Practice breast self-awareness. This means understanding how your breasts normally appear and feel.  It also means doing regular breast self-exams. Let your health care provider know about any changes, no matter how small.  If you are in your 20s or 30s, you should have a clinical breast exam (CBE) by a health care provider every 1-3 years as part of a regular health exam.  If you are 40 or older, have a CBE every year. Also consider having a breast X-ray (mammogram) every year.  If you have a family history of breast cancer, talk to your health care provider about genetic screening.  If you   are at high risk for breast cancer, talk to your health care provider about having an MRI and a mammogram every year.  Breast cancer gene (BRCA) assessment is recommended for women who have family members with BRCA-related cancers. BRCA-related cancers include:  Breast.  Ovarian.  Tubal.  Peritoneal cancers.  Results of the assessment will determine the need for genetic counseling and BRCA1 and BRCA2 testing. Cervical Cancer Your health care provider may recommend that you be screened regularly for cancer of the pelvic organs (ovaries, uterus, and  vagina). This screening involves a pelvic examination, including checking for microscopic changes to the surface of your cervix (Pap test). You may be encouraged to have this screening done every 3 years, beginning at age 21.  For women ages 30-65, health care providers may recommend pelvic exams and Pap testing every 3 years, or they may recommend the Pap and pelvic exam, combined with testing for human papilloma virus (HPV), every 5 years. Some types of HPV increase your risk of cervical cancer. Testing for HPV may also be done on women of any age with unclear Pap test results.  Other health care providers may not recommend any screening for nonpregnant women who are considered low risk for pelvic cancer and who do not have symptoms. Ask your health care provider if a screening pelvic exam is right for you.  If you have had past treatment for cervical cancer or a condition that could lead to cancer, you need Pap tests and screening for cancer for at least 20 years after your treatment. If Pap tests have been discontinued, your risk factors (such as having a new sexual partner) need to be reassessed to determine if screening should resume. Some women have medical problems that increase the chance of getting cervical cancer. In these cases, your health care provider may recommend more frequent screening and Pap tests. Colorectal Cancer  This type of cancer can be detected and often prevented.  Routine colorectal cancer screening usually begins at 59 years of age and continues through 59 years of age.  Your health care provider may recommend screening at an earlier age if you have risk factors for colon cancer.  Your health care provider may also recommend using home test kits to check for hidden blood in the stool.  A small camera at the end of a tube can be used to examine your colon directly (sigmoidoscopy or colonoscopy). This is done to check for the earliest forms of colorectal  cancer.  Routine screening usually begins at age 50.  Direct examination of the colon should be repeated every 5-10 years through 59 years of age. However, you may need to be screened more often if early forms of precancerous polyps or small growths are found. Skin Cancer  Check your skin from head to toe regularly.  Tell your health care provider about any new moles or changes in moles, especially if there is a change in a mole's shape or color.  Also tell your health care provider if you have a mole that is larger than the size of a pencil eraser.  Always use sunscreen. Apply sunscreen liberally and repeatedly throughout the day.  Protect yourself by wearing long sleeves, pants, a wide-brimmed hat, and sunglasses whenever you are outside. HEART DISEASE, DIABETES, AND HIGH BLOOD PRESSURE   High blood pressure causes heart disease and increases the risk of stroke. High blood pressure is more likely to develop in:  People who have blood pressure in the high end   of the normal range (130-139/85-89 mm Hg).  People who are overweight or obese.  People who are African American.  If you are 38-23 years of age, have your blood pressure checked every 3-5 years. If you are 61 years of age or older, have your blood pressure checked every year. You should have your blood pressure measured twice--once when you are at a hospital or clinic, and once when you are not at a hospital or clinic. Record the average of the two measurements. To check your blood pressure when you are not at a hospital or clinic, you can use:  An automated blood pressure machine at a pharmacy.  A home blood pressure monitor.  If you are between 45 years and 39 years old, ask your health care provider if you should take aspirin to prevent strokes.  Have regular diabetes screenings. This involves taking a blood sample to check your fasting blood sugar level.  If you are at a normal weight and have a low risk for diabetes,  have this test once every three years after 59 years of age.  If you are overweight and have a high risk for diabetes, consider being tested at a younger age or more often. PREVENTING INFECTION  Hepatitis B  If you have a higher risk for hepatitis B, you should be screened for this virus. You are considered at high risk for hepatitis B if:  You were born in a country where hepatitis B is common. Ask your health care provider which countries are considered high risk.  Your parents were born in a high-risk country, and you have not been immunized against hepatitis B (hepatitis B vaccine).  You have HIV or AIDS.  You use needles to inject street drugs.  You live with someone who has hepatitis B.  You have had sex with someone who has hepatitis B.  You get hemodialysis treatment.  You take certain medicines for conditions, including cancer, organ transplantation, and autoimmune conditions. Hepatitis C  Blood testing is recommended for:  Everyone born from [birth year] through 1964.  Anyone with known risk factors for hepatitis C. Sexually transmitted infections (STIs)  You should be screened for sexually transmitted infections (STIs) including gonorrhea and chlamydia if:  You are sexually active and are younger than 59 years of age.  You are older than 59 years of age and your health care provider tells you that you are at risk for this type of infection.  Your sexual activity has changed since you were last screened and you are at an increased risk for chlamydia or gonorrhea. Ask your health care provider if you are at risk.  If you do not have HIV, but are at risk, it may be recommended that you take a prescription medicine daily to prevent HIV infection. This is called pre-exposure prophylaxis (PrEP). You are considered at risk if:  You are sexually active and do not regularly use condoms or know the HIV status of your partner(s).  You take drugs by injection.  You are sexually  active with a partner who has HIV. Talk with your health care provider about whether you are at high risk of being infected with HIV. If you choose to begin PrEP, you should first be tested for HIV. You should then be tested every 3 months for as long as you are taking PrEP.  PREGNANCY   If you are premenopausal and you may become pregnant, ask your health care provider about preconception counseling.  If you may  become pregnant, take 400 to 800 micrograms (mcg) of folic acid every day.  If you want to prevent pregnancy, talk to your health care provider about birth control (contraception). OSTEOPOROSIS AND MENOPAUSE   Osteoporosis is a disease in which the bones lose minerals and strength with aging. This can result in serious bone fractures. Your risk for osteoporosis can be identified using a bone density scan.  If you are 61 years of age or older, or if you are at risk for osteoporosis and fractures, ask your health care provider if you should be screened.  Ask your health care provider whether you should take a calcium or vitamin D supplement to lower your risk for osteoporosis.  Menopause may have certain physical symptoms and risks.  Hormone replacement therapy may reduce some of these symptoms and risks. Talk to your health care provider about whether hormone replacement therapy is right for you.  HOME CARE INSTRUCTIONS   Schedule regular health, dental, and eye exams.  Stay current with your immunizations.   Do not use any tobacco products including cigarettes, chewing tobacco, or electronic cigarettes.  If you are pregnant, do not drink alcohol.  If you are breastfeeding, limit how much and how often you drink alcohol.  Limit alcohol intake to no more than 1 drink per day for nonpregnant women. One drink equals 12 ounces of beer, 5 ounces of wine, or 1 ounces of hard liquor.  Do not use street drugs.  Do not share needles.  Ask your health care provider for help if  you need support or information about quitting drugs.  Tell your health care provider if you often feel depressed.  Tell your health care provider if you have ever been abused or do not feel safe at home.   This information is not intended to replace advice given to you by your health care provider. Make sure you discuss any questions you have with your health care provider.   Document Released: 07/30/2010 Document Revised: 02/04/2014 Document Reviewed: 12/16/2012 Elsevier Interactive Patient Education [employer].

## 2015-04-17 NOTE — Progress Notes (Signed)
   Subjective:    Patient ID: Judith KindleLori Holladay, female    DOB: 07-Mar-1955, 60 y.o.   MRN: [id number]  Pt presents to the office today for pap. Pt states she had a Annual physical in 02/28/15. Pt states her blood work was "good". PT states she had neck surgery in January and is followed by Ortho every month right now.  Gynecologic Exam Pertinent negatives include no headaches.  Anxiety Presents for follow-up visit. Onset was more than 5 years ago. The problem has been waxing and waning. Symptoms include excessive worry and nervous/anxious behavior. Patient reports no confusion, depressed mood, insomnia, malaise, palpitations or shortness of breath. Symptoms occur occasionally. The quality of sleep is good.   Her past medical history is significant for anxiety/panic attacks. There is no history of depression. Past treatments include benzodiazephines. The treatment provided moderate relief. Compliance with prior treatments has been good.      Review of Systems  Constitutional: Negative.   HENT: Negative.   Eyes: Negative.   Respiratory: Negative.  Negative for shortness of breath.   Cardiovascular: Negative.  Negative for palpitations.  Gastrointestinal: Negative.   Endocrine: Negative.   Genitourinary: Negative.   Musculoskeletal: Negative.   Neurological: Negative.  Negative for headaches.  Hematological: Negative.   Psychiatric/Behavioral: Negative for confusion. The patient is nervous/anxious. The patient does not have insomnia.   All other systems reviewed and are negative.      Objective:   Physical Exam  Constitutional: She is oriented to person, place, and time. She appears well-developed and well-nourished. No distress.  HENT:  Head: Normocephalic and atraumatic.  Right Ear: External ear normal.  Left Ear: External ear normal.  Nose: Nose normal.  Mouth/Throat: Oropharynx is clear and moist.  Eyes: Pupils are equal, round, and reactive to light.  Neck: Normal range of  motion. Neck supple. No thyromegaly present.  Cardiovascular: Normal rate, regular rhythm, normal heart sounds and intact distal pulses.   No murmur heard. Pulmonary/Chest: Effort normal and breath sounds normal. No respiratory distress. She has no wheezes. Right breast exhibits no inverted nipple, no mass, no nipple discharge, no skin change and no tenderness. Left breast exhibits no inverted nipple, no mass, no nipple discharge, no skin change and no tenderness. Breasts are symmetrical.  Abdominal: Soft. Bowel sounds are normal. She exhibits no distension. There is no tenderness.  Genitourinary: Vagina normal.  Bimanual exam- no adnexal masses or tenderness, ovaries nonpalpable   Cervix parous and pink- No discharge   Musculoskeletal: Normal range of motion. She exhibits no edema or tenderness.  Neurological: She is alert and oriented to person, place, and time. She has normal reflexes. No cranial nerve deficit.  Skin: Skin is warm and dry.  Psychiatric: She has a normal mood and affect. Her behavior is normal. Judgment and thought content normal.  Vitals reviewed.   BP 137/78 mmHg  Pulse 88  Temp(Src) 97.2 F (36.2 C) (Oral)  Ht 4' 11.75" (1.518 m)  Wt 125 lb (56.7 kg)  BMI 24.61 kg/m2       Assessment & Plan:  1. GAD (generalized anxiety disorder) -Continue medications  2. Left arm pain -Keep ortho appts  3. Encounter for routine gynecological examination - Pap IG w/ reflex to HPV when ASC-U   Continue all meds Labs pending Health Maintenance reviewed Diet and exercise encouraged RTO 6 months  Jannifer Rodneyhristy Abbigayle Toole, FNP

## 2015-04-19 LAB — PAP IG W/ RFLX HPV ASCU: PAP Smear Comment: 0

## 2015-10-31 ENCOUNTER — Encounter: Payer: Self-pay | Admitting: Physical Therapy

## 2015-10-31 ENCOUNTER — Ambulatory Visit: Payer: 59 | Attending: Emergency Medicine | Admitting: Physical Therapy

## 2015-10-31 DIAGNOSIS — M25612 Stiffness of left shoulder, not elsewhere classified: Secondary | ICD-10-CM | POA: Insufficient documentation

## 2015-10-31 DIAGNOSIS — M25512 Pain in left shoulder: Secondary | ICD-10-CM | POA: Insufficient documentation

## 2015-10-31 DIAGNOSIS — G8929 Other chronic pain: Secondary | ICD-10-CM | POA: Diagnosis present

## 2015-10-31 NOTE — Therapy (Signed)
[facility] [address] [place], [place], [address] Phone: [phone]   Fax:  [phone]  Physical Therapy Evaluation  Patient Details  Name: Judith Saunders MRN: [id number] Date of Birth: 06/19/1955 Referring Provider: Hoyt Koch MD  Encounter Date: 10/31/2015      PT End of Session - 10/31/15 0952    Visit Number 1   Number of Visits 12   Date for PT Re-Evaluation 12/12/15   PT Start Time 0952   PT Stop Time 1027   PT Time Calculation (min) 35 min   Activity Tolerance Patient tolerated treatment well   Behavior During Therapy [facility] for tasks assessed/performed      Past Medical History:  Diagnosis Date  . Tennis elbow In past   Patient reports.    History reviewed. No pertinent surgical history.  There were no vitals filed for this visit.       Subjective Assessment - 10/31/15 0958    Subjective Patient states her shoulder/arm feels great compared to before surgery. She states the surgeon told her husband that she had no restrictions with the shoulder except for full OH and lifting heavy items overhead. Patient has been vacuuming and doing farm chores. She picked up a bag of sand floor to feet off ground. Carrying water buckets with straight arms.   Patient Stated Goals To get full motion without pain.   Currently in Pain? No/denies  8/10 with certain movements   Pain Score 0-No pain            [facility] PT Assessment - 10/31/15 0001      Assessment   Medical Diagnosis L shoulder pain s/p RCR, SAD, DCE, excision of deltoid mass   Referring Provider Hoyt Koch MD   Onset Date/Surgical Date 10/12/15   Hand Dominance Right   Next MD Visit late October     Precautions   Precautions Shoulder   Type of Shoulder Precautions RCR protocol     Restrictions   Weight Bearing Restrictions No     Balance Screen   Has the patient fallen in the past 6 months No   Has the patient had a decrease in activity  level because of a fear of falling?  No   Is the patient reluctant to leave their home because of a fear of falling?  No     Prior Function   Level of Independence Independent     ROM / Strength   AROM / PROM / Strength AROM;PROM;Strength     AROM   AROM Assessment Site Shoulder   Right/Left Shoulder Left   Left Shoulder Extension 39 Degrees   Left Shoulder Flexion 141 Degrees   Left Shoulder ABduction 144 Degrees   Left Shoulder Internal Rotation 45 Degrees   Left Shoulder External Rotation 90 Degrees     PROM   PROM Assessment Site Shoulder   Right/Left Shoulder Left   Left Shoulder Extension 65 Degrees   Left Shoulder Flexion 155 Degrees   Left Shoulder ABduction 155 Degrees     Palpation   Palpation comment tender or R supraspinatus, subscapularis                           PT Education - 10/31/15 1244    Education provided Yes   Education Details HEP   Person(s) Educated Patient   Methods Explanation;Demonstration;Handout   Comprehension Verbalized understanding;Returned demonstration          PT  Short Term Goals - 10/31/15 1308      PT SHORT TERM GOAL #1   Title I with HEP   Time 2   Period Weeks   Status New     PT SHORT TERM GOAL #2   Title --   Time --   Period --   Status --     PT SHORT TERM GOAL #3   Title --   Time --   Period --   Status --     PT SHORT TERM GOAL #4   Title --   Time --   Period --   Status --           PT Long Term Goals - 10/31/15 1311      PT LONG TERM GOAL #1   Title Patient to demo L shoulder ROM WFL to peform ADLS   Time 4   Period Weeks   Status New     PT LONG TERM GOAL #2   Title Patient to demo 5/5 L shoulder strength    Time 4   Period Weeks   Status New     PT LONG TERM GOAL #3   Title Patient to be able to peform ADLS with 2-3/10 pain or better.   Time 4   Period Weeks   Status New               Plan - 10/31/15 1244    Clinical Impression Statement  Patient presents s/p R RC debridement, SAD and DCE. She has decreased shoulder ROM and is limited with ADLs. She is anxious to return to PLOF two years ago.    Rehab Potential Excellent   PT Frequency 2x / week   PT Duration 6 weeks   PT Treatment/Interventions Cryotherapy;Electrical Stimulation;Iontophoresis 4mg /ml Dexamethasone;Moist Heat;Ultrasound;Therapeutic activities;Therapeutic exercise;Manual techniques   PT Next Visit Plan SAD protocol; attempt rockwood 4 (yellow).    PT Home Exercise Plan supine cane for flex; supine robber, cervical tension stretches, scapular retraction   Consulted and Agree with Plan of Care Patient      Patient will benefit from skilled therapeutic intervention in order to improve the following deficits and impairments:  Pain, Impaired UE functional use, Decreased range of motion  Visit Diagnosis: Stiffness of left shoulder, not elsewhere classified - Plan: PT plan of care cert/re-cert  Chronic left shoulder pain - Plan: PT plan of care cert/re-cert     Problem List Patient Active Problem List   Diagnosis Date Noted  . GAD (generalized anxiety disorder) 11/04/2014  . Left arm pain 11/04/2014    Solon PalmJulie Keshonda Monsour PT 10/31/2015, 1:19 PM  [facility] [address] [place], [place], [address] Phone: [phone]   Fax:  [phone]  Name: Judith Saunders MRN: [id number] Date of Birth: Aug 09, 1955

## 2015-10-31 NOTE — Patient Instructions (Signed)
  Flexibility: Upper Trapezius Stretch   Gently grasp right side of head while reaching behind back with other hand. Tilt head away until a gentle stretch is felt. Hold 30 seconds. Repeat 3 times per set. Do 2 sessions per day.  http://orth.exer.us/340   Levator Stretch   Grasp seat or sit on hand on side to be stretched. Turn head toward other side and look down. Use hand on head to gently stretch neck in that position. Hold _30___ seconds. Repeat on other side. Repeat 3 times. Do 2 sessions per day.  http://[email or web address].us/30   Scapular Retraction (Standing)   With arms at sides, pinch shoulder blades together. Repeat 10 times per set. Do 1-3 sets per session. Do 2 sessions per day.  Cane Exercise: Flexion   Lie on back, holding cane above chest. Keeping arms as straight as possible, lower cane toward floor beyond head. Hold _3___ seconds. Repeat 10-20____ times. Do 1-2____ sessions per day.   Robber: Scapular Retraction: Elbow Flexion (lying down)   With elbows bent to 90, pinch shoulder blades together and rotate arms out, keeping elbows bent. Repeat _10___ times per set. Do _1-3___ sets per session. Do __1__ sessions per day.  Solon PalmJulie Akshara Blumenthal, PT 10/31/15 10:27 AM [facility] [address] [place], [place], [address] Phone: [phone]   Fax:  [phone]

## 2015-11-08 ENCOUNTER — Ambulatory Visit: Payer: 59 | Admitting: Physical Therapy

## 2015-11-08 ENCOUNTER — Encounter: Payer: Self-pay | Admitting: Physical Therapy

## 2015-11-08 DIAGNOSIS — M25612 Stiffness of left shoulder, not elsewhere classified: Secondary | ICD-10-CM | POA: Diagnosis not present

## 2015-11-08 DIAGNOSIS — G8929 Other chronic pain: Secondary | ICD-10-CM

## 2015-11-08 DIAGNOSIS — M25512 Pain in left shoulder: Secondary | ICD-10-CM

## 2015-11-08 NOTE — Therapy (Signed)
[facility] [address] [place], [place], [address] Phone: [phone]   Fax:  [phone]  Physical Therapy Treatment  Patient Details  Name: Judith KindleLori Barnard MRN: [id number] Date of Birth: [birth year] Referring Provider: Hoyt KochJeffrey Henderson MD  Encounter Date: 11/08/2015      PT End of Session - 11/08/15 0907    Visit Number 2   Number of Visits 12   Date for PT Re-Evaluation 12/12/15   PT Start Time 0902   PT Stop Time 0955   PT Time Calculation (min) 53 min   Activity Tolerance Patient tolerated treatment well   Behavior During Therapy [facility] for tasks assessed/performed      Past Medical History:  Diagnosis Date  . Tennis elbow In past   Patient reports.    No past surgical history on file.  There were no vitals filed for this visit.      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 Reports that she has some pain and tightness in L UT and has difficulty with flexion and ER ROM. Reports that she still has trouble with getting shirts on and off.   Patient Stated Goals To get full motion without pain.   Currently in Pain? Yes   Pain Score 5    Pain Location Shoulder   Pain Orientation Left;Anterior;Posterior   Pain Descriptors / Indicators Sore;Tender   Pain Type Acute pain;Surgical pain            [facility] PT Assessment - 11/08/15 0001      Assessment   Medical Diagnosis L shoulder pain s/p RCR, SAD, DCE, excision of deltoid mass   Onset Date/Surgical Date 10/12/15   Hand Dominance Right   Next MD Visit late October     Precautions   Precautions Shoulder   Type of Shoulder Precautions RCR protocol     Restrictions   Weight Bearing Restrictions No                     [facility] Adult PT Treatment/Exercise - 11/08/15 0001      Exercises   Exercises Shoulder     Shoulder Exercises: Standing   Other  Standing Exercises LUE wall slides x30 reps     Shoulder Exercises: Pulleys   Flexion Other (comment)  x5 min   Other Pulley Exercises LUE ranger flex/circles x30 reps     Modalities   Modalities Electrical Stimulation;Moist Heat     Moist Heat Therapy   Number Minutes Moist Heat 10 Minutes   Moist Heat Location Shoulder     Electrical Stimulation   Electrical Stimulation Location L shoulder/ UT   Electrical Stimulation Action Pre-Mod   Electrical Stimulation Parameters 80-150 hz x15 min   Electrical Stimulation Goals Pain     Manual Therapy   Manual Therapy Soft tissue mobilization;Passive ROM   Soft tissue mobilization STW to L UT to decrease tightness    Passive ROM PROM of L shoulder into flex/ER/IR with gentle holds at end range                  PT Short Term Goals - 10/31/15 1308      PT SHORT TERM GOAL #1   Title I with HEP   Time 2   Period Weeks   Status New     PT SHORT TERM GOAL #2   Title --  Time --   Period --   Status --     PT SHORT TERM GOAL #3   Title --   Time --   Period --   Status --     PT SHORT TERM GOAL #4   Title --   Time --   Period --   Status --           PT Long Term Goals - 10/31/15 1311      PT LONG TERM GOAL #1   Title Patient to demo L shoulder ROM WFL to peform ADLS   Time 4   Period Weeks   Status New     PT LONG TERM GOAL #2   Title Patient to demo 5/5 L shoulder strength    Time 4   Period Weeks   Status New     PT LONG TERM GOAL #3   Title Patient to be able to peform ADLS with 2-3/10 pain or better.   Time 4   Period Weeks   Status New               Plan - 11/08/15 0943    Clinical Impression Statement Patient presented in clinic with L shoulder discomfort and L UT tightness and pain. Patient very tolerant of exercises and had only minimal complaint intermittantly with L shoulder discomfort. L UT tightness was prominant upon palpation with fairly good release with manual  therapy. Firm end feels noted with PROM of L shoulder into flex/ER/IR with smooth arc of motion. Patient was cautioned by both PTA and [place] App[place], MPT to use caution with L shoulder activity as she is still healing. Patient was encouraged to continue HEP at home with patient verbalizing understanding. Normal modalites response noted following removal of the modalities.    Rehab Potential Excellent   PT Frequency 2x / week   PT Duration 6 weeks   PT Treatment/Interventions Cryotherapy;Electrical Stimulation;Iontophoresis 4mg /ml Dexamethasone;Moist Heat;Ultrasound;Therapeutic activities;Therapeutic exercise;Manual techniques   PT Next Visit Plan Continue with L shoulder ROM exercises with cautious progression to strengthening per MPT POC. Patient is 4 weeks post-surgery 11/09/2015.   PT Home Exercise Plan supine cane for flex; supine robber, cervical tension stretches, scapular retraction   Consulted and Agree with Plan of Care Patient      Patient will benefit from skilled therapeutic intervention in order to improve the following deficits and impairments:  Pain, Impaired UE functional use, Decreased range of motion  Visit Diagnosis: Stiffness of left shoulder, not elsewhere classified  Chronic left shoulder pain     Problem List Patient Active Problem List   Diagnosis Date Noted  . GAD (generalized anxiety disorder) 11/04/2014  . Left arm pain 11/04/2014    [name], PTA 11/08/2015, 10:07 AM  [facility] [address] [place], [place], [address] Phone: [phone]   Fax:  [phone]  Name: Kaela Beitz MRN: [id number] Date of Birth: [birth year]

## 2015-11-13 ENCOUNTER — Encounter: Payer: Self-pay | Admitting: Physical Therapy

## 2015-11-13 ENCOUNTER — Ambulatory Visit: Payer: 59 | Admitting: Physical Therapy

## 2015-11-13 DIAGNOSIS — M25612 Stiffness of left shoulder, not elsewhere classified: Secondary | ICD-10-CM | POA: Diagnosis not present

## 2015-11-13 DIAGNOSIS — G8929 Other chronic pain: Secondary | ICD-10-CM

## 2015-11-13 DIAGNOSIS — M25512 Pain in left shoulder: Secondary | ICD-10-CM

## 2015-11-13 NOTE — Therapy (Signed)
[facility] [address] [place], [place], [address] Phone: [phone]   Fax:  [phone]  Physical Therapy Treatment  Patient Details  Name: Judith KindleLori Duross MRN: [id number] Date of Birth: Jan 28, 1956 Referring Provider: Hoyt KochJeffrey Henderson MD  Encounter Date: 11/13/2015      PT End of Session - 11/13/15 0856    Visit Number 3   Number of Visits 12   Date for PT Re-Evaluation 12/12/15   PT Start Time 0904   PT Stop Time 0952   PT Time Calculation (min) 48 min   Activity Tolerance Patient tolerated treatment well   Behavior During Therapy [facility] for tasks assessed/performed      Past Medical History:  Diagnosis Date  . Tennis elbow In past   Patient reports.    No past surgical history on file.  There were no vitals filed for this visit.      Subjective Assessment - 11/13/15 0856    Subjective Reports that her shoulder is alright and hurts with actvity. Patient continues to experience pain with donning shirts or jackets as well as pulling down shirts and intermittant pain with raising LUE. 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   Patient Stated Goals To get full motion without pain.   Currently in Pain? No/denies            [facility] PT Assessment - 11/13/15 0001      Assessment   Medical Diagnosis L shoulder pain s/p RCR, SAD, DCE, excision of deltoid mass   Onset Date/Surgical Date 10/12/15   Hand Dominance Right   Next MD Visit 11/22/2015     Precautions   Precautions Shoulder   Type of Shoulder Precautions RCR protocol     Restrictions   Weight Bearing Restrictions No     ROM / Strength   AROM / PROM / Strength AROM     AROM   Overall AROM  Deficits   AROM Assessment Site Shoulder   Right/Left Shoulder Left   Left Shoulder Flexion 136 Degrees   Left Shoulder External Rotation 70 Degrees                     [facility] Adult PT  Treatment/Exercise - 11/13/15 0001      Shoulder Exercises: Supine   External Rotation AAROM;Left;20 reps   Flexion AAROM;Both;20 reps     Shoulder Exercises: Seated   Other Seated Exercises B scapular squeeze x20 reps     Shoulder Exercises: Standing   Other Standing Exercises LUE wall slides x30 reps     Shoulder Exercises: Pulleys   Flexion Other (comment)  x5 min   Other Pulley Exercises LUE ranger flex/circles x30 reps     Modalities   Modalities Electrical Stimulation     Electrical Stimulation   Electrical Stimulation Location L shoulder   Electrical Stimulation Action Pre-Mod   Electrical Stimulation Parameters 80-150 hz x15 min   Electrical Stimulation Goals Pain     Manual Therapy   Manual Therapy Passive ROM   Passive ROM PROM of L shoulder into flex/ER with gentle holds at end range                  PT Short Term Goals - 11/13/15 [id number]      PT SHORT TERM GOAL #1   Title I with HEP   Time 2   Period Weeks   Status On-going  PT Long Term Goals - 10/31/15 1311      PT LONG TERM GOAL #1   Title Patient to demo L shoulder ROM WFL to peform ADLS   Time 4   Period Weeks   Status New     PT LONG TERM GOAL #2   Title Patient to demo 5/5 L shoulder strength    Time 4   Period Weeks   Status New     PT LONG TERM GOAL #3   Title Patient to be able to peform ADLS with 2-3/10 pain or better.   Time 4   Period Weeks   Status New               Plan - 11/13/15 0940    Clinical Impression Statement Patient presented in clinic with pain only with movement. Patient reported some improvement with pain with donning clothing since last week. Patient experienced some discomfort with exercises at end range specifically with supine AAROM flexion. Firm end feels noted with PROM of L shoulder in both flexion and ER with smooth arc of motion. AROM L shoulder flexion measured as 136 deg today in supine and 70 deg ER. Normal modalities  response noted following removal of the modalities. Patient educated to still be careful with activities as she is still healing from surgery and to continue HEP.   Rehab Potential Excellent   PT Frequency 2x / week   PT Duration 6 weeks   PT Treatment/Interventions Cryotherapy;Electrical Stimulation;Iontophoresis 4mg /ml Dexamethasone;Moist Heat;Ultrasound;Therapeutic activities;Therapeutic exercise;Manual techniques   PT Next Visit Plan Continue with L shoulder ROM exercises with cautious progression to strengthening per MPT POC. Patient is 4 weeks post-surgery 11/09/2015.   PT Home Exercise Plan supine cane for flex; supine robber, cervical tension stretches, scapular retraction   Consulted and Agree with Plan of Care Patient      Patient will benefit from skilled therapeutic intervention in order to improve the following deficits and impairments:  Pain, Impaired UE functional use, Decreased range of motion  Visit Diagnosis: Stiffness of left shoulder, not elsewhere classified  Chronic left shoulder pain     Problem List Patient Active Problem List   Diagnosis Date Noted  . GAD (generalized anxiety disorder) 11/04/2014  . Left arm pain 11/04/2014    [name], PTA 11/13/2015, 9:59 AM  [facility] [address] [place], [place], [address] Phone: [phone]   Fax:  [phone]  Name: Judith Saunders MRN: [id number] Date of Birth: 07/24/1955

## 2015-11-15 ENCOUNTER — Encounter: Payer: Self-pay | Admitting: Physical Therapy

## 2015-11-15 ENCOUNTER — Ambulatory Visit: Payer: 59 | Admitting: Physical Therapy

## 2015-11-15 DIAGNOSIS — M25612 Stiffness of left shoulder, not elsewhere classified: Secondary | ICD-10-CM

## 2015-11-15 DIAGNOSIS — G8929 Other chronic pain: Secondary | ICD-10-CM

## 2015-11-15 DIAGNOSIS — M25512 Pain in left shoulder: Secondary | ICD-10-CM

## 2015-11-15 NOTE — Therapy (Signed)
[facility] [address] [place], [place], [address] Phone: [phone]   Fax:  [phone]  Physical Therapy Treatment  Patient Details  Name: Judith KindleLori Armond MRN: [id number] Date of Birth: 10-22-1955 Referring Provider: Hoyt KochJeffrey Henderson MD  Encounter Date: 11/15/2015      PT End of Session - 11/15/15 0911    Visit Number 4   Number of Visits 12   Date for PT Re-Evaluation 12/12/15   PT Start Time 0900   PT Stop Time 0949   PT Time Calculation (min) 49 min   Activity Tolerance Patient tolerated treatment well   Behavior During Therapy [facility] for tasks assessed/performed      Past Medical History:  Diagnosis Date  . Tennis elbow In past   Patient reports.    No past surgical history on file.  There were no vitals filed for this visit.      Subjective Assessment - 11/15/15 0909    Subjective Reports that she has pain at night or upon waking. 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   Patient Stated Goals To get full motion without pain.   Currently in Pain? Yes   Pain Score --  No numerical pain rating provided   Pain Location Shoulder   Pain Orientation Left;Anterior;Proximal   Pain Descriptors / Indicators Other (Comment)  bruised   Pain Type Acute pain            [facility] PT Assessment - 11/15/15 0001      Assessment   Medical Diagnosis L shoulder pain s/p RCR, SAD, DCE, excision of deltoid mass   Onset Date/Surgical Date 10/12/15   Hand Dominance Right   Next MD Visit 11/22/2015     Precautions   Precautions Shoulder   Type of Shoulder Precautions RCR protocol     Restrictions   Weight Bearing Restrictions No                     [facility] Adult PT Treatment/Exercise - 11/15/15 0001      Shoulder Exercises: Supine   Protraction AAROM;Both  x30 reps   External Rotation AAROM;Left  x30 reps   Flexion AAROM;Both  x30 reps     Shoulder  Exercises: Pulleys   Flexion 3 minutes   Other Pulley Exercises LUE ranger flex/circles x30 reps   Other Pulley Exercises wall slides x20 reps     Shoulder Exercises: ROM/Strengthening   Rhythmic Stabilization, Supine L shoulder rhythmic stabilizations in ER/flex      Modalities   Modalities Electrical Stimulation;Moist Heat     Moist Heat Therapy   Number Minutes Moist Heat 15 Minutes   Moist Heat Location Shoulder     Electrical Stimulation   Electrical Stimulation Location L shoulder   Electrical Stimulation Action Pre-Mod   Electrical Stimulation Parameters 80-150 hz x15 min   Electrical Stimulation Goals Pain     Manual Therapy   Manual Therapy Passive ROM   Passive ROM PROM of L shoulder into flex/ER with gentle holds at end range                  PT Short Term Goals - 11/13/15 [id number]      PT SHORT TERM GOAL #1   Title I with HEP   Time 2   Period Weeks   Status On-going           PT Long Term Goals - 10/31/15  1311      PT LONG TERM GOAL #1   Title Patient to demo L shoulder ROM WFL to peform ADLS   Time 4   Period Weeks   Status New     PT LONG TERM GOAL #2   Title Patient to demo 5/5 L shoulder strength    Time 4   Period Weeks   Status New     PT LONG TERM GOAL #3   Title Patient to be able to peform ADLS with 2-3/10 pain or better.   Time 4   Period Weeks   Status New               Plan - 11/15/15 1049    Clinical Impression Statement Patient continues to tolerate treatment well with reports today of bruised/sore sensation especially in anterior L shoulder. Patient able to complete AAROM exercises well with only soreness complaint. Good L shoulder stability noted with rhythmic stabilization in ER and flexion today. Firm end feels noted with PROM of L shoulder into flexion and ER. Normal modalities response noted following removal of the modalities.   Rehab Potential Excellent   PT Frequency 2x / week   PT Duration 6 weeks    PT Treatment/Interventions Cryotherapy;Electrical Stimulation;Iontophoresis 4mg /ml Dexamethasone;Moist Heat;Ultrasound;Therapeutic activities;Therapeutic exercise;Manual techniques   PT Next Visit Plan Continue with L shoulder ROM exercises with cautious progression to strengthening per MPT POC. Patient is 4 weeks post-surgery 11/09/2015.   PT Home Exercise Plan supine cane for flex; supine robber, cervical tension stretches, scapular retraction   Consulted and Agree with Plan of Care Patient      Patient will benefit from skilled therapeutic intervention in order to improve the following deficits and impairments:  Pain, Impaired UE functional use, Decreased range of motion  Visit Diagnosis: Stiffness of left shoulder, not elsewhere classified  Chronic left shoulder pain     Problem List Patient Active Problem List   Diagnosis Date Noted  . GAD (generalized anxiety disorder) 11/04/2014  . Left arm pain 11/04/2014    Judith Saunders, PTA 11/15/2015, 10:57 AM  [facility] [address] [place], [place], [address] Phone: [phone]   Fax:  [phone]  Name: Judith Saunders MRN: [id number] Date of Birth: 11-24-1955

## 2015-11-20 ENCOUNTER — Encounter: Payer: Self-pay | Admitting: Physical Therapy

## 2015-11-20 ENCOUNTER — Ambulatory Visit: Payer: 59 | Admitting: Physical Therapy

## 2015-11-20 DIAGNOSIS — G8929 Other chronic pain: Secondary | ICD-10-CM

## 2015-11-20 DIAGNOSIS — M25512 Pain in left shoulder: Secondary | ICD-10-CM

## 2015-11-20 DIAGNOSIS — M25612 Stiffness of left shoulder, not elsewhere classified: Secondary | ICD-10-CM | POA: Diagnosis not present

## 2015-11-20 NOTE — Therapy (Addendum)
[facility] [place], [place], [address] Phone: [phone]   Fax:  [phone]  Physical Therapy Treatment  Patient Details  Name: Judith Saunders MRN: [id number] Date of Birth: May 24, 1955 Referring Provider: Fidela Salisbury MD  Encounter Date: 11/20/2015      PT End of Session - 11/20/15 0858    Visit Number 5   Number of Visits 12   Date for PT Re-Evaluation 12/12/15   PT Start Time 0901   PT Stop Time 0959   PT Time Calculation (min) 58 min   Activity Tolerance Patient tolerated treatment well   Behavior During Therapy [facility] for tasks assessed/performed      Past Medical History:  Diagnosis Date  . Tennis elbow In past   Patient reports.    No past surgical history on file.  There were no vitals filed for this visit.      Subjective Assessment - 11/20/15 0857    Subjective "I think I'm fine." Reports soreness, bruise like, popping, crackling sounds in L shoulder. Reports that she was very busy yesterday. Reports that her shoulder wakes her up at night.   Patient Stated Goals To get full motion without pain.   Currently in Pain? Yes   Pain Score 5    Pain Location Shoulder   Pain Orientation Left   Pain Descriptors / Indicators Sore   Pain Type Acute pain            [facility] PT Assessment - 11/20/15 0001      Assessment   Medical Diagnosis L shoulder pain s/p RCR, SAD, DCE, excision of deltoid mass   Onset Date/Surgical Date 10/12/15   Hand Dominance Right   Next MD Visit 11/22/2015     Precautions   Precautions Shoulder   Type of Shoulder Precautions RCR protocol     Restrictions   Weight Bearing Restrictions No     ROM / Strength   AROM / PROM / Strength AROM     AROM   Overall AROM  Deficits;Within functional limits for tasks performed   AROM Assessment Site Shoulder   Right/Left Shoulder Left   Left Shoulder Flexion 133 Degrees   Left Shoulder Internal Rotation 60 Degrees   Left  Shoulder External Rotation 82 Degrees                     [facility] Adult PT Treatment/Exercise - 11/20/15 0001      Shoulder Exercises: Supine   Protraction AAROM;Both  x30 reps   External Rotation AAROM;Left  x30 reps   Flexion AAROM;Both  x30 reps     Shoulder Exercises: Seated   Protraction AROM;Left;20 reps  forward reach   External Rotation AROM;Both;20 reps     Shoulder Exercises: Pulleys   Flexion Other (comment)  x5 min   Other Pulley Exercises LUE ranger flex/circles x30 reps   Other Pulley Exercises wall slides x20 reps     Modalities   Modalities Electrical Stimulation;Moist Heat     Moist Heat Therapy   Number Minutes Moist Heat 15 Minutes   Moist Heat Location Shoulder     Electrical Stimulation   Electrical Stimulation Location L shoulder   Electrical Stimulation Action Pre-Mod   Electrical Stimulation Parameters 80-150 hz x 15 min   Electrical Stimulation Goals Pain     Manual Therapy   Manual Therapy Soft tissue mobilization   Soft tissue mobilization STW to L UT, triceps, deltoids to decrease soreness and tightness  PT Short Term Goals - 11/13/15 2947      PT SHORT TERM GOAL #1   Title I with HEP   Time 2   Period Weeks   Status On-going           PT Long Term Goals - 11/20/15 0946      PT LONG TERM GOAL #1   Title Patient to demo L shoulder ROM WFL to peform ADLS   Time 4   Period Weeks   Status On-going     PT LONG TERM GOAL #2   Title Patient to demo 5/5 L shoulder strength    Time 4   Period Weeks   Status On-going     PT LONG TERM GOAL #3   Title Patient to be able to peform ADLS with 6-5/46 pain or better.   Time 4   Period Weeks   Status On-going               Plan - 11/20/15 0945    Clinical Impression Statement Patient continues to tolerate treatment well although today she experienced intemittant L anterior shoulder catching. Patient continues to report difficulty with  L shoulder horizontal adduction per patient report. Able to complete all exercises well with multimodal cueing for technique corrections intermittantly although at times she encountered L shoulder catching with activities. AROM L shoulder flexion in supine 133 deg, ER 82 deg, IR 60 deg. Minimal tightness noted in L UT today upon palpation with tightness noted in posterior L shoulder around triceps and into L deltoids. Normal modalities response noted following removal of the modalities. Patient denies any L shoulder strength deficits following L shoulder surgery.    Rehab Potential Excellent   PT Frequency 2x / week   PT Duration 6 weeks   PT Treatment/Interventions Cryotherapy;Electrical Stimulation;Iontophoresis 57m/ml Dexamethasone;Moist Heat;Ultrasound;Therapeutic activities;Therapeutic exercise;Manual techniques   PT Next Visit Plan Continue with L shoulder ROM exercises with cautious progression to strengthening per MPT POC.    PT Home Exercise Plan supine cane for flex; supine robber, cervical tension stretches, scapular retraction   Consulted and Agree with Plan of Care Patient      Patient will benefit from skilled therapeutic intervention in order to improve the following deficits and impairments:  Pain, Impaired UE functional use, Decreased range of motion  Visit Diagnosis: Stiffness of left shoulder, not elsewhere classified  Chronic left shoulder pain     Problem List Patient Active Problem List   Diagnosis Date Noted  . GAD (generalized anxiety disorder) 11/04/2014  . Left arm pain 11/04/2014   PHYSICAL THERAPY DISCHARGE SUMMARY  Visits from Start of Care: 5.  Current functional level related to goals / functional outcomes: See above.   Remaining deficits: Continued pain and loss of left shoulder pain.   Education / Equipment: HEP. Plan: Patient agrees to discharge.  Patient goals were not met. Patient is being discharged due to not returning since the last  visit.  ?????      Nathaly Dawkins, CMaliMPT 11/20/2015, 12:14 PM  [facility] [address]Phone: [phone]  Fax:  [phone] Name: Judith NeuserMRN: [id number] of Birth: 107-Jun-1957

## 2015-11-20 NOTE — Therapy (Signed)
[facility] [place], [place], [address] Phone: [phone]   Fax:  [phone]  Physical Therapy Treatment  Patient Details  Name: Judith Saunders MRN: [id number] Date of Birth: May 24, 1955 Referring Provider: Fidela Salisbury MD  Encounter Date: 11/20/2015      PT End of Session - 11/20/15 0858    Visit Number 5   Number of Visits 12   Date for PT Re-Evaluation 12/12/15   PT Start Time 0901   PT Stop Time 0959   PT Time Calculation (min) 58 min   Activity Tolerance Patient tolerated treatment well   Behavior During Therapy [facility] for tasks assessed/performed      Past Medical History:  Diagnosis Date  . Tennis elbow In past   Patient reports.    No past surgical history on file.  There were no vitals filed for this visit.      Subjective Assessment - 11/20/15 0857    Subjective "I think I'm fine." Reports soreness, bruise like, popping, crackling sounds in L shoulder. Reports that she was very busy yesterday. Reports that her shoulder wakes her up at night.   Patient Stated Goals To get full motion without pain.   Currently in Pain? Yes   Pain Score 5    Pain Location Shoulder   Pain Orientation Left   Pain Descriptors / Indicators Sore   Pain Type Acute pain            [facility] PT Assessment - 11/20/15 0001      Assessment   Medical Diagnosis L shoulder pain s/p RCR, SAD, DCE, excision of deltoid mass   Onset Date/Surgical Date 10/12/15   Hand Dominance Right   Next MD Visit 11/22/2015     Precautions   Precautions Shoulder   Type of Shoulder Precautions RCR protocol     Restrictions   Weight Bearing Restrictions No     ROM / Strength   AROM / PROM / Strength AROM     AROM   Overall AROM  Deficits;Within functional limits for tasks performed   AROM Assessment Site Shoulder   Right/Left Shoulder Left   Left Shoulder Flexion 133 Degrees   Left Shoulder Internal Rotation 60 Degrees   Left  Shoulder External Rotation 82 Degrees                     [facility] Adult PT Treatment/Exercise - 11/20/15 0001      Shoulder Exercises: Supine   Protraction AAROM;Both  x30 reps   External Rotation AAROM;Left  x30 reps   Flexion AAROM;Both  x30 reps     Shoulder Exercises: Seated   Protraction AROM;Left;20 reps  forward reach   External Rotation AROM;Both;20 reps     Shoulder Exercises: Pulleys   Flexion Other (comment)  x5 min   Other Pulley Exercises LUE ranger flex/circles x30 reps   Other Pulley Exercises wall slides x20 reps     Modalities   Modalities Electrical Stimulation;Moist Heat     Moist Heat Therapy   Number Minutes Moist Heat 15 Minutes   Moist Heat Location Shoulder     Electrical Stimulation   Electrical Stimulation Location L shoulder   Electrical Stimulation Action Pre-Mod   Electrical Stimulation Parameters 80-150 hz x 15 min   Electrical Stimulation Goals Pain     Manual Therapy   Manual Therapy Soft tissue mobilization   Soft tissue mobilization STW to L UT, triceps, deltoids to decrease soreness and tightness  PT Short Term Goals - 11/13/15 [id number]      PT SHORT TERM GOAL #1   Title I with HEP   Time 2   Period Weeks   Status On-going           PT Long Term Goals - 11/20/15 0946      PT LONG TERM GOAL #1   Title Patient to demo L shoulder ROM WFL to peform ADLS   Time 4   Period Weeks   Status On-going     PT LONG TERM GOAL #2   Title Patient to demo 5/5 L shoulder strength    Time 4   Period Weeks   Status On-going     PT LONG TERM GOAL #3   Title Patient to be able to peform ADLS with 2-3/10 pain or better.   Time 4   Period Weeks   Status On-going               Plan - 11/20/15 0945    Clinical Impression Statement Patient continues to tolerate treatment well although today she experienced intemittant L anterior shoulder catching. Patient continues to report difficulty with  L shoulder horizontal adduction per patient report. Able to complete all exercises well with multimodal cueing for technique corrections intermittantly although at times she encountered L shoulder catching with activities. AROM L shoulder flexion in supine 133 deg, ER 82 deg, IR 60 deg. Minimal tightness noted in L UT today upon palpation with tightness noted in posterior L shoulder around triceps and into L deltoids. Normal modalities response noted following removal of the modalities. Patient denies any L shoulder strength deficits following L shoulder surgery.    Rehab Potential Excellent   PT Frequency 2x / week   PT Duration 6 weeks   PT Treatment/Interventions Cryotherapy;Electrical Stimulation;Iontophoresis 4mg /ml Dexamethasone;Moist Heat;Ultrasound;Therapeutic activities;Therapeutic exercise;Manual techniques   PT Next Visit Plan Continue with L shoulder ROM exercises with cautious progression to strengthening per MPT POC.    PT Home Exercise Plan supine cane for flex; supine robber, cervical tension stretches, scapular retraction   Consulted and Agree with Plan of Care Patient      Patient will benefit from skilled therapeutic intervention in order to improve the following deficits and impairments:  Pain, Impaired UE functional use, Decreased range of motion  Visit Diagnosis: Stiffness of left shoulder, not elsewhere classified  Chronic left shoulder pain     Problem List Patient Active Problem List   Diagnosis Date Noted  . GAD (generalized anxiety disorder) 11/04/2014  . Left arm pain 11/04/2014    Florence CannerKelsey Parsons, PTA 11/20/15 10:10 AM  [facility] [address] [place], [place], [address] Phone: [phone]   Fax:  [phone]  Name: Judith Saunders MRN: [id number] Date of Birth: 05/16/1955

## 2018-01-27 ENCOUNTER — Emergency Department (INDEPENDENT_AMBULATORY_CARE_PROVIDER_SITE_OTHER): Payer: 59

## 2018-01-27 ENCOUNTER — Other Ambulatory Visit: Payer: Self-pay

## 2018-01-27 ENCOUNTER — Emergency Department (INDEPENDENT_AMBULATORY_CARE_PROVIDER_SITE_OTHER)
Admission: EM | Admit: 2018-01-27 | Discharge: 2018-01-27 | Disposition: A | Discharge status: Home / Self Care | Payer: 59 | Source: Home / Self Care | Attending: Family Medicine | Admitting: Family Medicine

## 2018-01-27 DIAGNOSIS — R05 Cough: Secondary | ICD-10-CM

## 2018-01-27 DIAGNOSIS — J209 Acute bronchitis, unspecified: Secondary | ICD-10-CM

## 2018-01-27 DIAGNOSIS — R509 Fever, unspecified: Secondary | ICD-10-CM

## 2018-01-27 MED ORDER — AZITHROMYCIN 250 MG PO TABS: ORAL_TABLET | ORAL | 0 refills | Status: AC

## 2018-01-27 MED ORDER — GUAIFENESIN-CODEINE 100-10 MG/5ML PO SOLN: ORAL | 0 refills | Status: AC

## 2018-01-27 NOTE — ED Provider Notes (Signed)
Jud[facility] Sa[facility]    CSN: [id number] Arrival date & time: 01/27/18  8657     History   Chief Complaint Chief Complaint  Patient presents with  . Cough    HPI Judith Saunders is a 62 y.o. female.   Patient complains of four day history of typical cold-like symptoms developing over several days, including mild sore throat, sinus congestion, headache, fatigue, and nonproductive cough.  She has had fever to 102 with chills, tightness in her chest, and occasional wheezing.  The history is provided by the patient.    Past Medical History:  Diagnosis Date  . Tennis elbow In past   Patient reports.    Patient Active Problem List   Diagnosis Date Noted  . GAD (generalized anxiety disorder) 11/04/2014  . Left arm pain 11/04/2014    Past surgical history: Tonsillectomy Facial cosmetic surgery Augmentation mammoplasty C-spine surgery Shoulder surgery  OB History   No obstetric history on file.      Home Medications    Prior to Admission medications   Medication Sig Start Date End Date Taking? Authorizing Provider  dextromethorphan (DELSYM) 30 MG/5ML liquid Take by mouth as needed for cough.   Yes [provider]  ibuprofen (ADVIL,MOTRIN) 200 MG tablet Take 200 mg by mouth every 6 (six) hours as needed.   Yes [provider]  ALPRAZolam Prudy Feeler) 1 MG tablet Take 1 mg by mouth at bedtime as needed.      [provider]  azithromycin (ZITHROMAX Z-PAK) 250 MG tablet Take 2 tabs today; then begin one tab once daily for 4 more days. 01/27/18   Lattie Haw, MD  gabapentin (NEURONTIN) 400 MG capsule TAKE 1 CAPSULE 3 TIMES DAILY. 03/21/15   [provider]  guaiFENesin-codeine 100-10 MG/5ML syrup Take 10mL by mouth at bedtime as needed for cough.  May repeat dose in 4 to 6 hours. 01/27/18   Lattie Haw, MD  traMADol (ULTRAM) 50 MG tablet Take by mouth every 6 (six) hours as needed.    [provider]    Family  History Arthritis: Brother Cancer:  Brother and maternal grandmother MI:  Father  Social History Social History   Tobacco Use  . Smoking status: Former Smoker    Last attempt to quit: 01/27/2001    Years since quitting: 17.0  . Smokeless tobacco: Never Used  Substance Use Topics  . Alcohol use: No  . Drug use: No     Allergies   Bupropion; Diclofenac; Eggs or egg-derived products; Prednisone; and Hydrocodone   Review of Systems Review of Systems + sore throat + cough No pleuritic pain but has tightness in anterior chest. ? wheezing + nasal congestion + post-nasal drainage No sinus pain/pressure No itchy/red eyes No earache No hemoptysis No SOB + fever, + chills No nausea No vomiting No abdominal pain No diarrhea No urinary symptoms No skin rash + fatigue + myalgias + headache Used OTC meds without relief   Physical Exam Triage Vital Signs ED Triage Vitals  Enc Vitals Group     BP 01/27/18 0926 135/84     Pulse Rate 01/27/18 0926 99     Resp --      Temp 01/27/18 0926 99.3 F (37.4 C)     Temp Source 01/27/18 0926 Oral     SpO2 01/27/18 0926 97 %     Weight 01/27/18 0927 122 lb (55.3 kg)     Height 01/27/18 0927 5' (1.524 m)  Head Circumference --      Peak Flow --      Pain Score 01/27/18 0927 3     Pain Loc --      Pain Edu? --      Excl. in GC? --    No data found.  Updated Vital Signs BP 135/84 (BP Location: Right Arm)   Pulse 99   Temp 99.3 F (37.4 C) (Oral)   Ht 5' (1.524 m)   Wt 55.3 kg   SpO2 97%   BMI 23.83 kg/m   Visual Acuity Right Eye Distance:   Left Eye Distance:   Bilateral Distance:    Right Eye Near:   Left Eye Near:    Bilateral Near:     Physical Exam Nursing notes and Vital Signs reviewed. Appearance:  Patient appears stated age, and in no acute distress Eyes:  Pupils are equal, round, and reactive to light and accomodation.  Extraocular movement is intact.  Conjunctivae are not inflamed  Ears:   Canals normal.  Tympanic membranes normal.  Nose:  Mildly congested turbinates.  No sinus tenderness.   Pharynx:  Normal Neck:  Supple.  Enlarged posterior/lateral nodes are palpated bilaterally, tender to palpation on the left.   Lungs:  Bilateral diffuse rhonchi.  Faint rales at left base.  Breath sounds are equal.  Moving air well. Heart:  Regular rate and rhythm without murmurs, rubs, or gallops.  Rate 108 Abdomen:  Nontender without masses or hepatosplenomegaly.  Bowel sounds are present.  No CVA or flank tenderness.  Extremities:  No edema.  Skin:  No rash present.    UC Treatments / Results  Labs (all labs ordered are listed, but only abnormal results are displayed) Labs Reviewed - No data to display  EKG None  Radiology Dg Chest 2 View  Result Date: 01/27/2018 CLINICAL DATA:  Cough for 4 days with persistent fever EXAM: CHEST - 2 VIEW COMPARISON:  No prior. FINDINGS: Mediastinum and hilar structures normal. Mild peribronchial cuffing. Mild bronchitis could not be excluded. No focal infiltrate. No pleural effusion or pneumothorax. Heart size normal. No acute bony abnormality. Prior cervical spine fusion. IMPRESSION: Mild peribronchial cuffing suggesting bronchitis. Electronically Signed   By: Maisie Fushomas  Register   On: 01/27/2018 10:33    Procedures Procedures (including critical care time)  Medications Ordered in UC Medications - No data to display  Initial Impression / Assessment and Plan / UC Course  I have reviewed the triage vital signs and the nursing notes.  Pertinent labs & imaging results that were available during my care of the patient were reviewed by me and considered in my medical decision making (see chart for details).    Begin Z-pak for atypical coverage. Rx for Robitussin AC for night time cough.  Controlled Substance Prescriptions I have consulted the  Controlled Substances Registry for this patient, and feel the risk/benefit ratio today is favorable  for proceeding with this prescription for a controlled substance.   Followup with Family Doctor if not improved in one week.    Final Clinical Impressions(s) / UC Diagnoses   Final diagnoses:  Acute bronchitis, unspecified organism     Discharge Instructions     Take plain guaifenesin (1200mg  extended release tabs such as Mucinex) twice daily, with plenty of water, for cough and congestion.  May add Pseudoephedrine (30mg , one or two every 4 to 6 hours) for sinus congestion.  Get adequate rest.   Stop all antihistamines for now, and other non-prescription cough/cold  preparations.        ED Prescriptions    Medication Sig Dispense Auth. Provider   azithromycin (ZITHROMAX Z-PAK) 250 MG tablet Take 2 tabs today; then begin one tab once daily for 4 more days. 6 tablet Lattie HawBeese, Stephen A, MD   guaiFENesin-codeine 100-10 MG/5ML syrup Take 10mL by mouth at bedtime as needed for cough.  May repeat dose in 4 to 6 hours. 100 mL Lattie HawBeese, Stephen A, MD        Lattie HawBeese, Stephen A, MD 02/07/18 1230

## 2018-01-27 NOTE — Discharge Instructions (Addendum)
Take plain guaifenesin (1200mg extended release tabs such as Mucinex) twice daily, with plenty of water, for cough and congestion.  May add Pseudoephedrine (30mg, one or two every 4 to 6 hours) for sinus congestion.  Get adequate rest.   °Stop all antihistamines for now, and other non-prescription cough/cold preparations. °  °  °  °

## 2018-01-27 NOTE — ED Triage Notes (Signed)
Dry cough, fever, chills, body aches x 3 days

## 2018-01-31 ENCOUNTER — Telehealth: Payer: Self-pay | Admitting: Family Medicine

## 2018-01-31 ENCOUNTER — Telehealth: Payer: Self-pay | Admitting: *Deleted

## 2018-01-31 MED ORDER — GUAIFENESIN-CODEINE 100-10 MG/5ML PO SOLN: ORAL | 0 refills | Status: AC

## 2018-01-31 MED ORDER — PREDNISONE 20 MG PO TABS: ORAL_TABLET | ORAL | 0 refills | Status: AC

## 2018-01-31 NOTE — Telephone Encounter (Signed)
Begin prednisone burst. 20mg  once daily for 4 days, #5, no refill  Will refill Robitussin AC for bedtime.  Continue Mucinex and fluids.  Recommend follow-up if not improved about 5 days.

## 2018-01-31 NOTE — Telephone Encounter (Signed)
Patient reports her cough syrup has 2 doses left and she is minimally improved, coughing continuously. Please advise.

## 2018-02-09 ENCOUNTER — Telehealth (INDEPENDENT_AMBULATORY_CARE_PROVIDER_SITE_OTHER): Payer: 59

## 2018-02-09 DIAGNOSIS — J209 Acute bronchitis, unspecified: Secondary | ICD-10-CM | POA: Diagnosis not present

## 2018-02-09 LAB — POCT INFLUENZA A/B
Influenza A, POC: NEGATIVE
Influenza B, POC: NEGATIVE

## 2020-07-26 IMAGING — DX DG CHEST 2V
2 series · 2 of 2 positions shown · non-contrast
Comparison: No prior.

CLINICAL DATA: Cough for 4 days with persistent fever

EXAM:
CHEST - 2 VIEW

[chest pa]
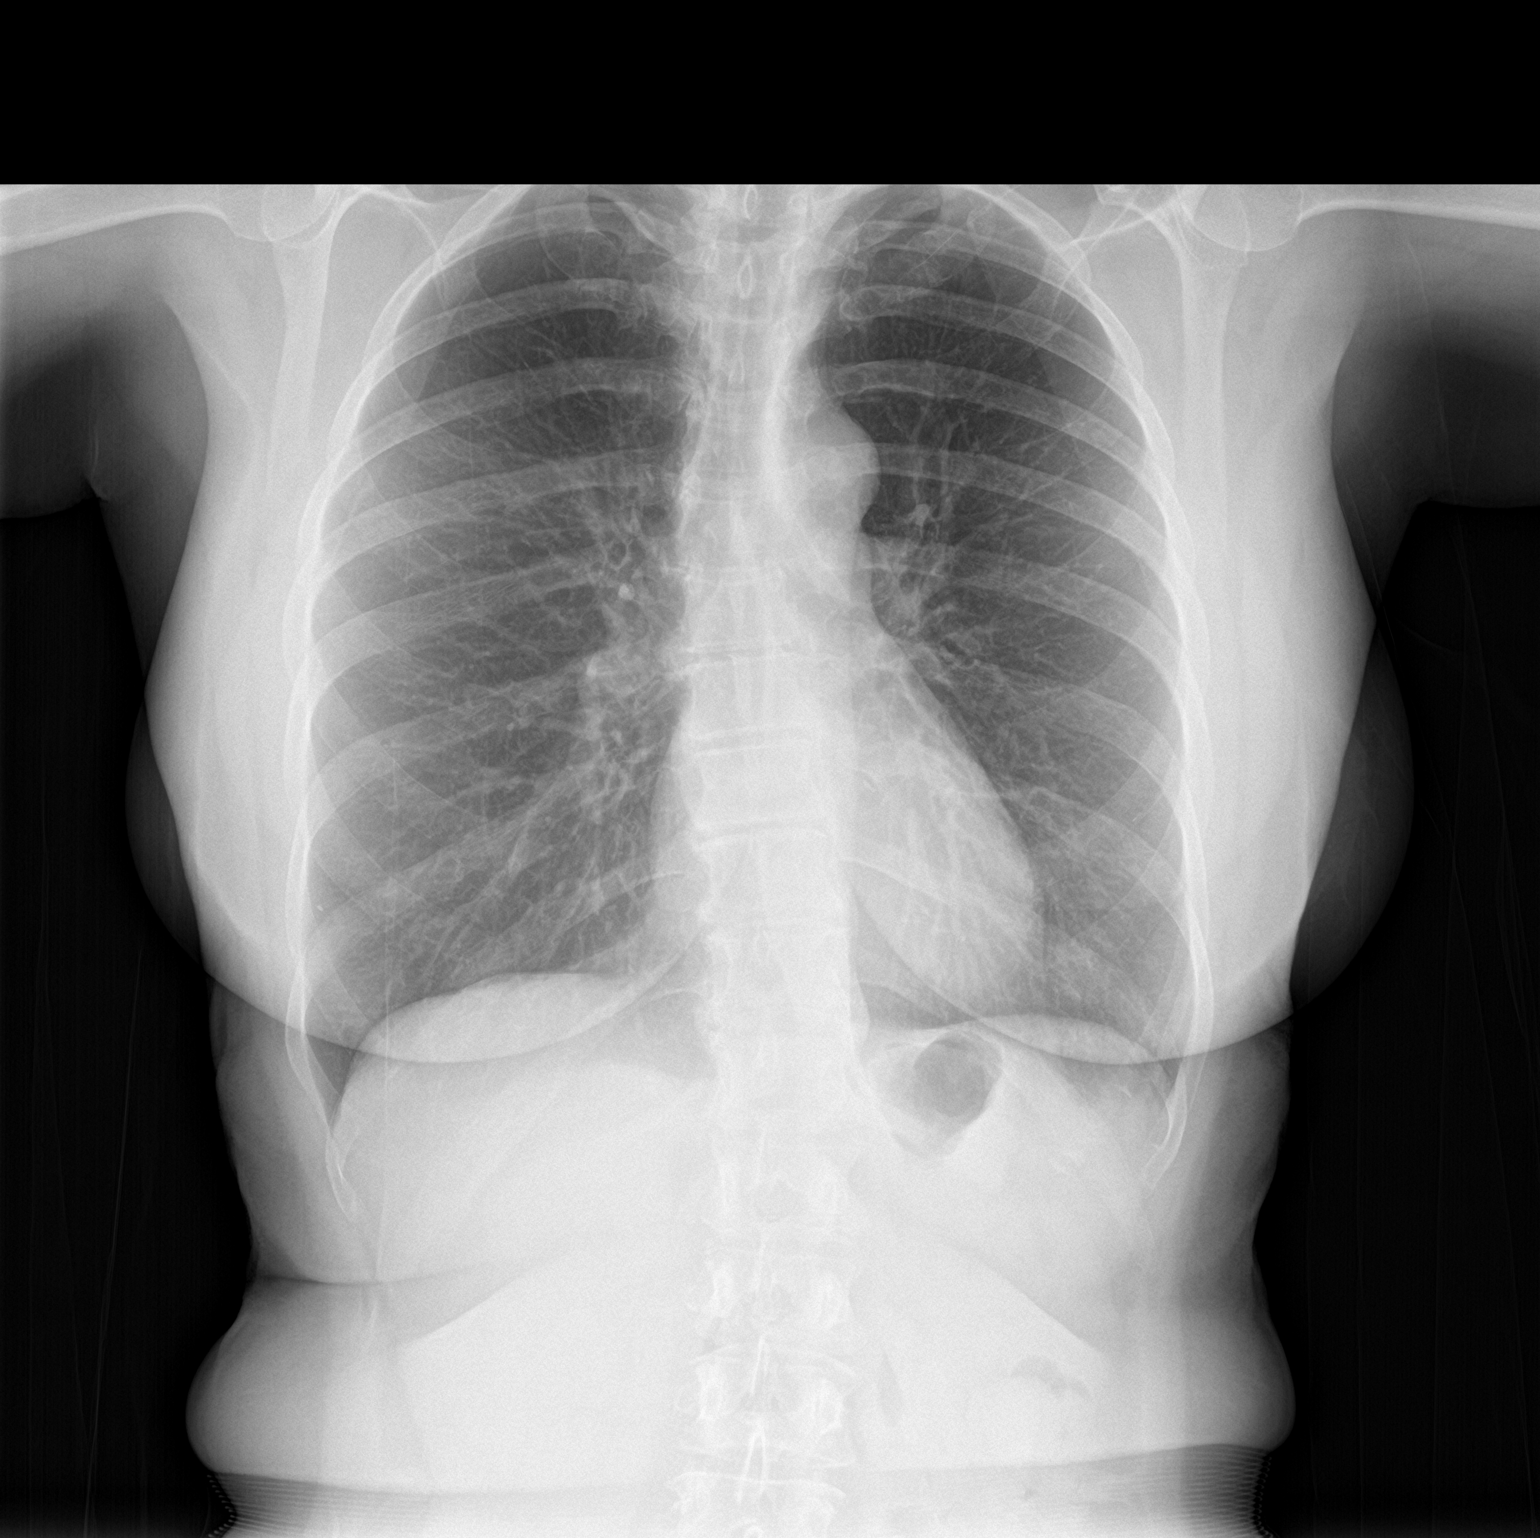

[chest lat]
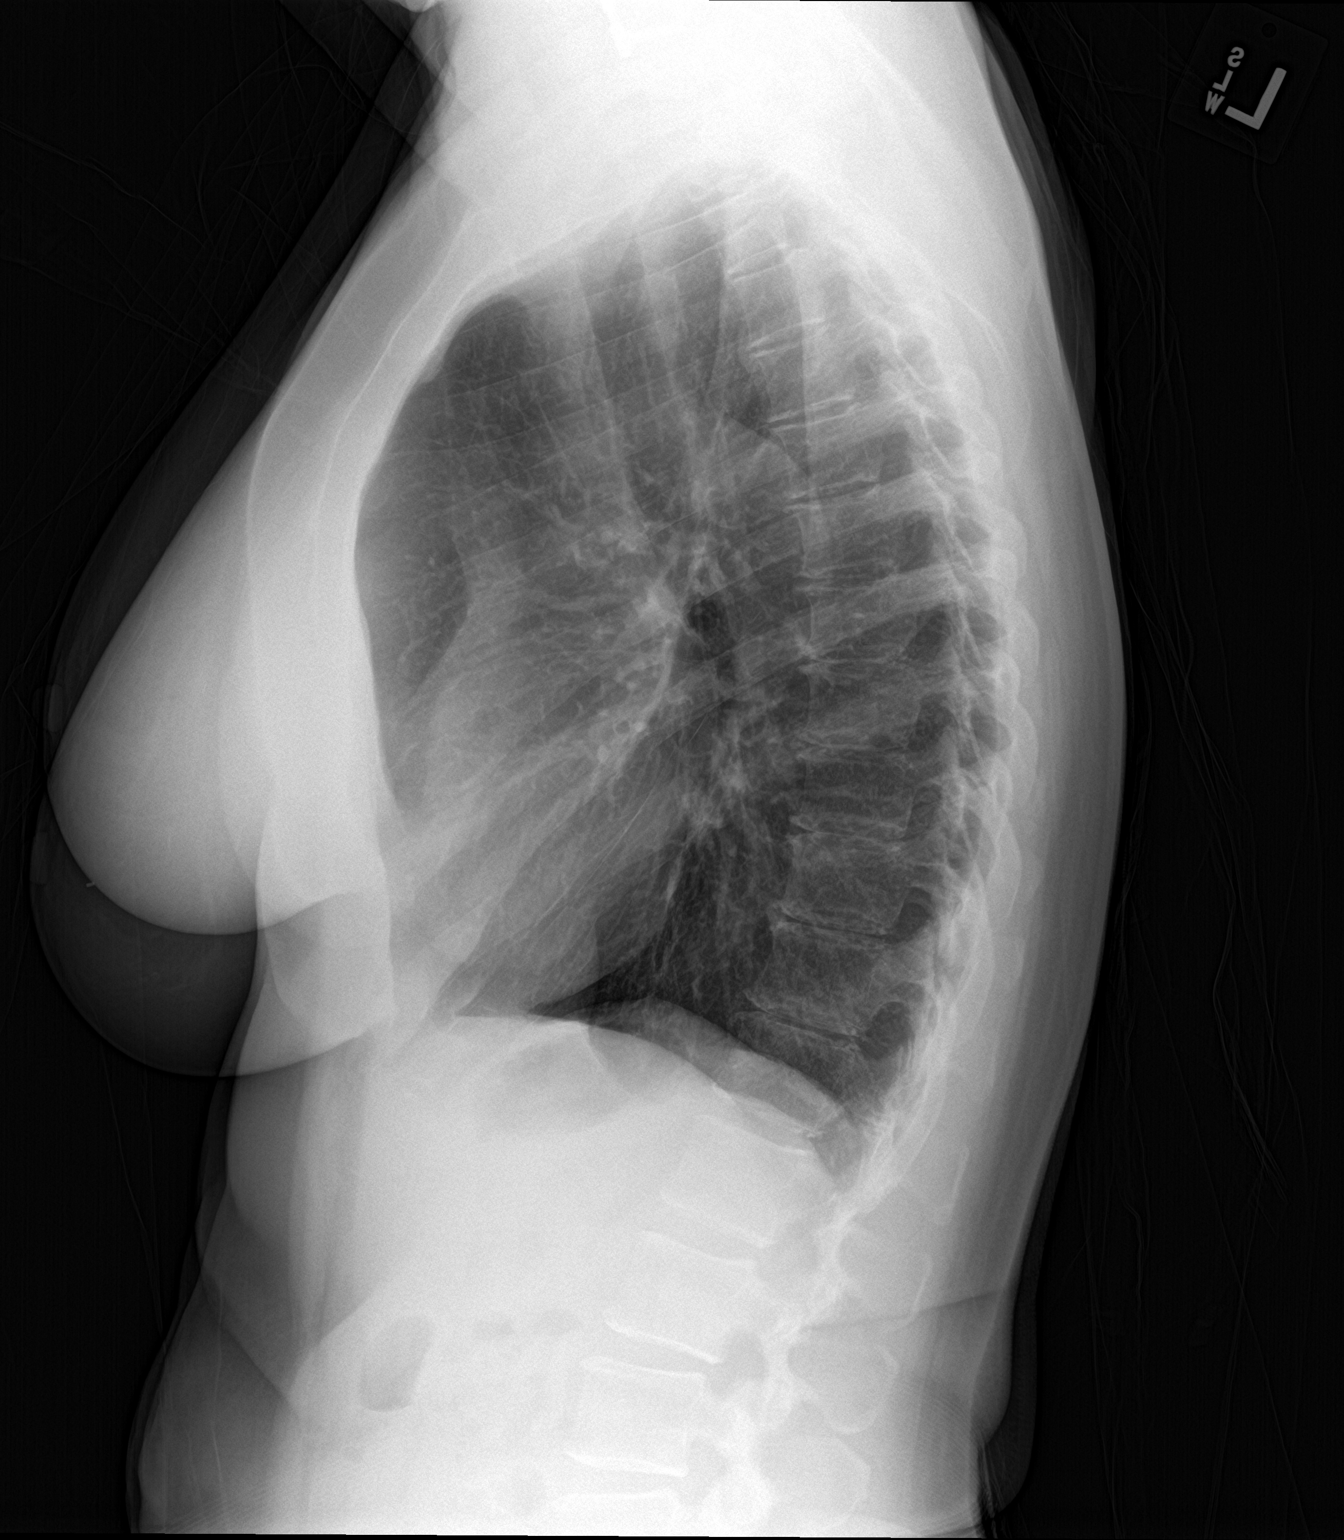

[2 of 2 positions shown; findings below may reference images not displayed]

FINDINGS: Mediastinum and hilar structures normal. Mild peribronchial cuffing.
Mild bronchitis could not be excluded. No focal infiltrate. No
pleural effusion or pneumothorax. Heart size normal. No acute bony
abnormality. Prior cervical spine fusion.
IMPRESSION: Mild peribronchial cuffing suggesting bronchitis.
